# Patient Record
Sex: Male | Born: 1955 | Race: White | Hispanic: No | Marital: Married | Smoking: Never smoker
Health system: Southern US, Community
[De-identification: ages and names within clinical notes are randomized; demographics above are authoritative.]

## PROBLEM LIST (undated history)

## (undated) DIAGNOSIS — M1990 Unspecified osteoarthritis, unspecified site: Secondary | ICD-10-CM

## (undated) DIAGNOSIS — N400 Enlarged prostate without lower urinary tract symptoms: Secondary | ICD-10-CM

## (undated) HISTORY — PX: WISDOM TOOTH EXTRACTION: SHX21

## (undated) HISTORY — PX: HERNIA REPAIR: SHX51

## (undated) HISTORY — DX: Benign prostatic hyperplasia without lower urinary tract symptoms: N40.0

## (undated) HISTORY — PX: SHOULDER SURGERY: SHX246

## (undated) HISTORY — DX: Unspecified osteoarthritis, unspecified site: M19.90

## (undated) HISTORY — PX: OTHER SURGICAL HISTORY: SHX169

## (undated) HISTORY — PX: COLONOSCOPY: SHX174

---

## 2004-11-20 ENCOUNTER — Ambulatory Visit: Payer: Self-pay | Admitting: Internal Medicine

## 2004-12-04 ENCOUNTER — Ambulatory Visit: Payer: Self-pay | Admitting: Internal Medicine

## 2004-12-04 HISTORY — PX: POLYPECTOMY: SHX149

## 2006-11-15 ENCOUNTER — Ambulatory Visit: Payer: Self-pay | Admitting: Sports Medicine

## 2006-11-15 DIAGNOSIS — M7660 Achilles tendinitis, unspecified leg: Secondary | ICD-10-CM | POA: Insufficient documentation

## 2009-12-02 ENCOUNTER — Encounter (INDEPENDENT_AMBULATORY_CARE_PROVIDER_SITE_OTHER): Payer: Self-pay | Admitting: *Deleted

## 2010-07-23 NOTE — Letter (Signed)
Summary: Colonoscopy Letter  [facility]  [address] [place], [place] [address]   Phone: [phone]  Fax: [phone]      December 02, 2009 MRN: [id number]   Jonathan Wagner [address] [place], [place]  [address]   Dear Jonathan Wagner,   According to your medical record, it is time for you to schedule a Colonoscopy. The American Cancer Society recommends this procedure as a method to detect early colon cancer. Patients with a family history of colon cancer, or a personal history of colon polyps or inflammatory bowel disease are at increased risk.  This letter has been generated based on the recommendations made at the time of your procedure. If you feel that in your particular situation this may no longer apply, please contact our office.  Please call our office at [phone] to schedule this appointment or to update your records at your earliest convenience.  Thank you for cooperating with [place] to provide you with the very best care possible.   Sincerely,  Wilhemina Bonito. Marina Goodell, M.D.  [facility] [phone]

## 2011-01-05 ENCOUNTER — Encounter: Payer: Self-pay | Admitting: Sports Medicine

## 2011-01-05 ENCOUNTER — Ambulatory Visit (INDEPENDENT_AMBULATORY_CARE_PROVIDER_SITE_OTHER): Payer: BC Managed Care – PPO | Admitting: Sports Medicine

## 2011-01-05 VITALS — BP 122/79 | HR 67 | Ht 72.0 in | Wt 207.0 lb

## 2011-01-05 DIAGNOSIS — M79609 Pain in unspecified limb: Secondary | ICD-10-CM

## 2011-01-05 DIAGNOSIS — M25569 Pain in unspecified knee: Secondary | ICD-10-CM

## 2011-01-05 DIAGNOSIS — M79673 Pain in unspecified foot: Secondary | ICD-10-CM

## 2011-01-05 DIAGNOSIS — M222X9 Patellofemoral disorders, unspecified knee: Secondary | ICD-10-CM | POA: Insufficient documentation

## 2011-01-05 DIAGNOSIS — M7660 Achilles tendinitis, unspecified leg: Secondary | ICD-10-CM

## 2011-01-05 MED ORDER — KETOPROFEN POWD: Status: DC

## 2011-01-05 NOTE — Patient Instructions (Signed)
1. Do strengthening exercises from handout 1-2 times daily.  2. Wear knee sleeve with exercise.  3. Follow up in 4-6 weeks.  4. Use over the counter ibuprofen if needed for pain.  5. Use topical pain reliever as needed for heel pain.  6. Your diagnosis is patellofemoral syndrome.

## 2011-01-05 NOTE — Progress Notes (Signed)
  Subjective:    Patient ID: Jonathan Wagner, male    DOB: [birth year], 55 y.o.   MRN: [id number]  HPI  55 y/o male is complaining of "decreased power" in his right knee when trying to push off while playing tennis or running.  These symptoms started after he took a hard overhead swing while playing tennis.  He felt like the knee was "compressed" under his body weight.  He is also complaining of a dull nagging pain behind his patella which worsened around the time of this incident.  This pain is worse after sitting for long periods and improves with walking.  He continues to play tennis but doesn't feel as strong.  He denies locking, giving out, instability with pivoting, night pain or swelling of the knee.  He is also complaining of a return of his left heel pain.  No trauma.  The pain is stinging, occurs with eversion or plantar flexion and lasts for seconds.   Review of Systems     Objective:   Physical Exam  Right Knee: Pain and grinding of the lateral patella are noted with flexion. Normal to inspection with no erythema or effusion or obvious bony abnormalities. No joint line tenderness. ROM normal in flexion and extension and lower leg rotation. Ligaments with solid consistent endpoints including ACL, PCL, LCL, MCL. Negative Mcmurray's and provocative meniscal tests. Patellar and quadriceps tendons unremarkable. Hamstring and quadriceps strength is normal.  Left Heel: Mild tenderness to palpation of the lateral calcaneus No tenderness over the achilles tendon. Normal range of motion of the ankle and foot No tenderness to palpation of the foot other than the calcaneus          Assessment & Plan:

## 2011-01-06 NOTE — Assessment & Plan Note (Signed)
Given static exercises x 2 and then series of step up/ down exercises  We also encouraged some cross training on a bike or recumbent bike to help with quadriceps tone and strengthening  He is okay to continue playing or other sports as long as he keeps his pain level to the mild range  Use his compression sleeve when playing

## 2011-01-06 NOTE — Assessment & Plan Note (Signed)
Achilles tendons were unremarkable today. The left medial heel pain is probably from some alteration in gait. We will follow this and consider ultrasound scan if it does not resolve.

## 2011-06-25 ENCOUNTER — Ambulatory Visit (INDEPENDENT_AMBULATORY_CARE_PROVIDER_SITE_OTHER): Payer: BC Managed Care – PPO | Admitting: Sports Medicine

## 2011-06-25 VITALS — BP 110/70

## 2011-06-25 DIAGNOSIS — M6580 Other synovitis and tenosynovitis, unspecified site: Secondary | ICD-10-CM

## 2011-06-25 DIAGNOSIS — M25569 Pain in unspecified knee: Secondary | ICD-10-CM

## 2011-06-25 DIAGNOSIS — M76899 Other specified enthesopathies of unspecified lower limb, excluding foot: Secondary | ICD-10-CM | POA: Insufficient documentation

## 2011-06-25 DIAGNOSIS — M222X9 Patellofemoral disorders, unspecified knee: Secondary | ICD-10-CM

## 2011-06-25 NOTE — Progress Notes (Signed)
  Subjective:    Patient ID: Jonathan Wagner, male    DOB: Nov 08, 1955, 56 y.o.   MRN: [id number]  HPI  Pt presents to clinic for f/u of rt anterior knee pain which is improving. Able to play tennis more effectively, but still has some limitations. Has soreness after playing tennis, and aches after driving longer distances.  He continues to wear bodyhelix knee sleeve for activity which is helpful. No night time pain.    Review of Systems     Objective:   Physical Exam  Excellent quad strength bilat Full flexion of rt knee ACL stable on rt Rt hip abduction strong Neg thessaly on rt  Lt Knee: Normal to inspection with no erythema or effusion or obvious bony abnormalities. Palpation normal with no warmth or joint line tenderness or patellar tenderness or condyle tenderness. ROM normal in flexion and extension and lower leg rotation. Ligaments with solid consistent endpoints including ACL, PCL, LCL, MCL. Negative Mcmurray's and provocative meniscal tests. Non painful patellar compression. Patellar and quadriceps tendons unremarkable. Hamstring and quadriceps strength is normal.  MSK u/s: spurring along superior border of patella and calcific changes in quad tendon on rt  There is a small effusion in the superior patellar pouch on the right but not on the left Left superior patella does not show the same degree of spurring although there is one small spur The retropatellar groove and lateral and medial patellar borders look normal Patellar tendon is normal       Assessment & Plan:

## 2011-06-25 NOTE — Patient Instructions (Addendum)
Start doing exercises that are quad dominant such as- stationary bike, stair master, squats, and lunges   Do 3 sets of 15 reps - step up exercises holding about 50 lbs  Use bodyhelix knee sleeve for activity and driving  Ice after playing tennis  Please follow up in 3 months  Thank you for seeing [place] today!

## 2011-06-25 NOTE — Assessment & Plan Note (Signed)
see. Instructions  The calcific change along the superior patellar border and the spurring are likely the cause of his anterior knee pain  Approach this with rehabilitation and compression sleeve  If he did not respond I would consider adding nitroglycerin

## 2011-06-25 NOTE — Assessment & Plan Note (Signed)
He continues to show a pattern primarily of anterior knee pain Minimal if any signs of arthritis Exam is improved

## 2011-08-12 ENCOUNTER — Telehealth: Payer: Self-pay | Admitting: *Deleted

## 2011-08-12 NOTE — Telephone Encounter (Signed)
Called patient on home number, [phone].  I left a message for the patient to call back regarding scheduling his next colonoscopy with Dr. Yancey Flemings.  Advised that his last colonoscopy was 2006 and he is overdue.

## 2011-08-18 NOTE — Telephone Encounter (Signed)
Called home number and spoke to patients wife.  They did get my message I left on 08-12-2011 and she told me to call his cell number of [phone].  I called and left a message for him to call us and we will schedule him for his colonoscopy.

## 2011-08-20 ENCOUNTER — Other Ambulatory Visit: Payer: Self-pay | Admitting: *Deleted

## 2011-09-07 ENCOUNTER — Encounter: Payer: Self-pay | Admitting: Internal Medicine

## 2011-10-14 ENCOUNTER — Encounter: Payer: BC Managed Care – PPO | Admitting: Internal Medicine

## 2011-10-20 ENCOUNTER — Encounter: Payer: Self-pay | Admitting: Internal Medicine

## 2011-10-20 ENCOUNTER — Ambulatory Visit (AMBULATORY_SURGERY_CENTER): Payer: BC Managed Care – PPO | Admitting: *Deleted

## 2011-10-20 VITALS — Ht 72.0 in | Wt 214.0 lb

## 2011-10-20 DIAGNOSIS — Z1211 Encounter for screening for malignant neoplasm of colon: Secondary | ICD-10-CM

## 2011-10-20 MED ORDER — MOVIPREP 100 G PO SOLR: ORAL | Status: DC

## 2011-11-03 ENCOUNTER — Encounter: Payer: BC Managed Care – PPO | Admitting: Internal Medicine

## 2012-01-03 ENCOUNTER — Encounter: Payer: BC Managed Care – PPO | Admitting: Internal Medicine

## 2012-01-18 ENCOUNTER — Telehealth: Payer: Self-pay | Admitting: Internal Medicine

## 2012-01-18 DIAGNOSIS — Z1211 Encounter for screening for malignant neoplasm of colon: Secondary | ICD-10-CM

## 2012-01-19 MED ORDER — MOVIPREP 100 G PO SOLR: ORAL | Status: DC

## 2012-01-19 NOTE — Telephone Encounter (Signed)
Sent Moviprep to [facility] on [address]

## 2012-01-21 ENCOUNTER — Encounter: Payer: Self-pay | Admitting: Internal Medicine

## 2012-01-21 ENCOUNTER — Ambulatory Visit (AMBULATORY_SURGERY_CENTER): Payer: BC Managed Care – PPO | Admitting: Internal Medicine

## 2012-01-21 VITALS — BP 122/85 | HR 70 | Temp 97.3°F | Resp 18 | Ht 72.0 in | Wt 214.0 lb

## 2012-01-21 DIAGNOSIS — D126 Benign neoplasm of colon, unspecified: Secondary | ICD-10-CM

## 2012-01-21 DIAGNOSIS — Z86010 Personal history of colonic polyps: Secondary | ICD-10-CM

## 2012-01-21 DIAGNOSIS — K635 Polyp of colon: Secondary | ICD-10-CM

## 2012-01-21 DIAGNOSIS — Z1211 Encounter for screening for malignant neoplasm of colon: Secondary | ICD-10-CM

## 2012-01-21 MED ORDER — SODIUM CHLORIDE 0.9 % IV SOLN: 500.0000 mL | INTRAVENOUS | Status: DC

## 2012-01-21 NOTE — Progress Notes (Signed)
Patient did not experience any of the following events: a burn prior to discharge; a fall within the facility; wrong site/side/patient/procedure/implant event; or a hospital transfer or hospital admission upon discharge from the facility. (G8907) Patient did not have preoperative order for IV antibiotic SSI prophylaxis. (G8918)  

## 2012-01-21 NOTE — Patient Instructions (Addendum)

## 2012-01-21 NOTE — Op Note (Signed)
[facility] [address] [place], [place]  [address]  COLONOSCOPY PROCEDURE REPORT  PATIENT:  Jonathan, Wagner  MR#:  [id number] BIRTHDATE:  01-22-1956, 56 yrs. old  GENDER:  male ENDOSCOPIST:  Wilhemina Bonito. Eda Keys, MD REF. BY:  Surveillance Program Recall, PROCEDURE DATE:  01/21/2012 PROCEDURE:  Colonoscopy with snare polypectomy x 2 ASA CLASS:  Class I INDICATIONS:  history of pre-cancerous (adenomatous) colon polyps, surveillance and high-risk screening ; index exam 11-2004 w/ TAs MEDICATIONS:   MAC sedation, administered by CRNA, propofol (Diprivan) 250 mg IV  DESCRIPTION OF PROCEDURE:   After the risks benefits and alternatives of the procedure were thoroughly explained, informed consent was obtained.  Digital rectal exam was performed and revealed no abnormalities.   The LB CF-[id number] endoscope was introduced through the anus and advanced to the cecum, which was identified by both the appendix and ileocecal valve, without limitations.  The quality of the prep was good, using MoviPrep. The instrument was then slowly withdrawn as the colon was fully examined. <<PROCEDUREIMAGES>>  FINDINGS:  A 4mm polyp was found in the cecum and snared without cautery. Retrieval was successful.  A 9mm hyperplastic appearring sessile polyp was found in the mid transverse colon and snared without cautery. Retrieval was successful. Moderate diverticulosis was found in the sigmoid colon.   Retroflexed views in the rectum revealed no abnormalities and internal hemorrhoids.    The time to cecum = 1:50  minutes. The scope was then withdrawn in   21:25 minutes from the cecum and the procedure completed.  COMPLICATIONS:  None  ENDOSCOPIC IMPRESSION: 1) Diminutive polyp in the cecum - removed 2) Sessile polyp in the mid transverse colon - removed 3) Moderate diverticulosis in the sigmoid colon 4) Internal hemorrhoids  RECOMMENDATIONS: 1) Follow up colonoscopy in 5  years  ______________________________ Wilhemina Bonito. Eda Keys, MD  CC:  Geoffry Paradise, MD;  The Patient  n. eSIGNED:   Wilhemina Bonito. Eda Keys at 01/21/2012 12:11 PM  Junious Silk, [phone]

## 2012-01-21 NOTE — Progress Notes (Signed)
1240PATIENT STATING HE FEELS ONLY BLOATING NOW. STILL PASSING FLATUS. PATIENT COMPLAINED OF PAIN ON REMOVAL OF TAPE IN DISCONTINUING IV.,JERKING ARM AWAY FROM NURSE.

## 2012-01-24 ENCOUNTER — Telehealth: Payer: Self-pay

## 2012-01-24 NOTE — Telephone Encounter (Signed)
  Follow up Call-  Call back number 01/21/2012  Post procedure Call Back phone  # [phone]  Permission to leave phone message Yes     Patient questions:  Do you have a fever, pain , or abdominal swelling? no Pain Score  0 *  Have you tolerated food without any problems? yes  Have you been able to return to your normal activities? yes  Do you have any questions about your discharge instructions: Diet   no Medications  no Follow up visit  no  Do you have questions or concerns about your Care? no  Actions: * If pain score is 4 or above: No action needed, pain <4.

## 2012-02-01 ENCOUNTER — Encounter: Payer: Self-pay | Admitting: Internal Medicine

## 2012-11-30 ENCOUNTER — Ambulatory Visit (INDEPENDENT_AMBULATORY_CARE_PROVIDER_SITE_OTHER): Payer: BC Managed Care – PPO | Admitting: Sports Medicine

## 2012-11-30 VITALS — BP 118/81 | HR 78

## 2012-11-30 DIAGNOSIS — M7700 Medial epicondylitis, unspecified elbow: Secondary | ICD-10-CM

## 2012-11-30 DIAGNOSIS — M7712 Lateral epicondylitis, left elbow: Secondary | ICD-10-CM | POA: Insufficient documentation

## 2012-11-30 DIAGNOSIS — M7702 Medial epicondylitis, left elbow: Secondary | ICD-10-CM

## 2012-11-30 DIAGNOSIS — M76822 Posterior tibial tendinitis, left leg: Secondary | ICD-10-CM | POA: Insufficient documentation

## 2012-11-30 DIAGNOSIS — M76829 Posterior tibial tendinitis, unspecified leg: Secondary | ICD-10-CM

## 2012-11-30 MED ORDER — NITROGLYCERIN 0.2 MG/HR TD PT24: MEDICATED_PATCH | TRANSDERMAL | Status: DC

## 2012-11-30 NOTE — Assessment & Plan Note (Signed)
Ankle stability exercises Pigeon toe walking Followup in 6 weeks

## 2012-11-30 NOTE — Patient Instructions (Signed)
Thank you for coming in today. 1) Elbow: You have medial epicondylitis. This is also known as golfers elbow.  Please continue icing. Also do the eccentric wrist exercises. Remember to go down slowly.  Also use the nitroglycerin patch daily.  Return in 6 weeks.   2) Ankle: This is mild post tibial tendonitis.  Please use the compression sleeve.  Also do the heel raises.  2 sets of 30 with your toes forwards and 1 set with the toes in.   Return in 6 weeks.   Nitroglycerin Protocol   Apply 1/4 nitroglycerin patch to affected area daily.  Change position of patch within the affected area every 24 hours.  You may experience a headache during the first 1-2 weeks of using the patch, these should subside.  If you experience headaches after beginning nitroglycerin patch treatment, you may take your preferred over the counter pain reliever.  Another side effect of the nitroglycerin patch is skin irritation or rash related to patch adhesive.  Please notify our office if you develop more severe headaches or rash, and stop the patch.  Tendon healing with nitroglycerin patch may require 12 to 24 weeks depending on the extent of injury.  Men should not use if taking Viagra, Cialis, or Levitra.   Do not use if you have migraines or rosacea.

## 2012-11-30 NOTE — Assessment & Plan Note (Signed)
With tearing seen on ultrasound. Plan: Compression Eccentric exercises Nitroglycerin patch protocol Followup in 6 weeks

## 2012-11-30 NOTE — Progress Notes (Signed)
Jonathan Wagner is a 57 y.o. male who presents to [facility] today for  1) Left medial elbow pain present for 3-4 months. The pain is worse with tennis, especially forehand and overhand. He has tired icing and ibuprofen which help. He denies any radiating pain, weakness or numbness. No injury, locking or catching. No previous doctor visits for this issue.   2) Left medial ankle pain: Present for 1 month. He was playing tennis and reached for a ball. He felt a twinge in his medial ankle. He was able to continue playing but was less mobile.  He was seen on a Court by an orthopedic surgeon to diagnosed him tentatively with posterior tibialis tendinitis. He has used topical NSAIDs ice and range of motion exercises. He notes this is helping and he is slowly improving.    PMH reviewed.  History  Substance Use Topics  . Smoking status: Never Smoker   . Smokeless tobacco: Never Used  . Alcohol Use: 6.0 oz/week    12 drink(s) per week     Comment: DRINK A 12 PACK PER WEEK   ROS as above otherwise neg. No fevers or chills   Exam:  BP 118/81  Pulse 78 Gen: Well NAD MSK: Left elbow.  Normal-appearing no swelling Full range of motion Mildly tender over the medial upper condyle Nontender over lateral upper condyle anterior or posterior elbow Pain with resisted wrist flexion Strength is intact Pulses capillary refill and sensation are intact distally  Left foot: Broad forefoot with high arch Normal-appearing Achilles tendon. No ankle effusion Mildly tender over the posterior tibialis tendon course, especially in the tarsal tunnel. Normal motion sensation and capillary refill. Normal heel varus with toe standing  Gait is preserved  Limited musculoskeletal ultrasound of the left elbow: Medial upper condyle visualized.  Significant hypoechoic change with superficially and deep to the insertion. This is associated with increased Doppler activity.  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 No tears seen Normal flexor digitorum tendon and neurovascular structures.    No results found.

## 2012-12-25 ENCOUNTER — Telehealth: Payer: Self-pay | Admitting: *Deleted

## 2012-12-25 NOTE — Telephone Encounter (Signed)
Pt called wants to know if he should put NTG patch on left ankle for post tib tendinitis?  States he is getting swelling in the ankle despite cutting back on tennis, not too painful though.  Also wants to know if it is normal for him to get mild aching in elbow when wearing NTG patch?

## 2012-12-26 NOTE — Telephone Encounter (Signed)
Left VM for pt to return my call  

## 2012-12-27 NOTE — Telephone Encounter (Signed)
Spoke with pt - advised ok to try 1/4 NTG patch on ankle per Dr. Darrick Penna, and sensitivity may be coming from increased blood flow to elbow due to NTG.  Try to continue using if not causing pain.

## 2013-01-11 ENCOUNTER — Encounter: Payer: Self-pay | Admitting: Sports Medicine

## 2013-01-11 ENCOUNTER — Ambulatory Visit (INDEPENDENT_AMBULATORY_CARE_PROVIDER_SITE_OTHER): Payer: BC Managed Care – PPO | Admitting: Sports Medicine

## 2013-01-11 VITALS — BP 104/68 | HR 80 | Ht 72.0 in | Wt 214.0 lb

## 2013-01-11 DIAGNOSIS — M76822 Posterior tibial tendinitis, left leg: Secondary | ICD-10-CM

## 2013-01-11 DIAGNOSIS — M7702 Medial epicondylitis, left elbow: Secondary | ICD-10-CM

## 2013-01-11 DIAGNOSIS — M222X1 Patellofemoral disorders, right knee: Secondary | ICD-10-CM

## 2013-01-11 DIAGNOSIS — M76829 Posterior tibial tendinitis, unspecified leg: Secondary | ICD-10-CM

## 2013-01-11 DIAGNOSIS — M25569 Pain in unspecified knee: Secondary | ICD-10-CM

## 2013-01-11 DIAGNOSIS — M7700 Medial epicondylitis, unspecified elbow: Secondary | ICD-10-CM

## 2013-01-11 NOTE — Progress Notes (Signed)
  Subjective:    Patient ID: Jonathan Wagner, male    DOB: Nov 03, 1955, 57 y.o.   MRN: [id number]  HPI  Pt presents to clinic for f/u of Lt medial elbow, and left ankle which have improrved. Lt Ankle 75% better.  Body helix full ankle very helpful. Left med elbow 50% better. Unable to tolerate elbow sleeve during tennis due to tightness.  Icing after playing, has decreased string tension on racquet.  Reduced tennis from 4 to 2 times per week. Using NTG 1/4 patch daily, but feels more tender over elbow when he is wearing these.   Has developed rt anterior knee pain over the past 2 weeks.  No specific injury. Pain is a soreness that lasts for 2-3 days after playing tennis      Review of Systems     Objective:   Physical Exam  NAD  L ankle: No ttp or swelling of post tib tendon No swelling of dorsum No swelling of AT Ankle ligaments tight  L elbow: Mild pain with resisted elbow flexion No pain on finger flexion Full flexion, extension, IR and ER ttp over medial epicondyle   R Knee: Slight discomfort with Thessaly Knee: Normal to inspection with no erythema or effusion or obvious bony abnormalities. Palpation normal with no warmth or joint line tenderness or patellar tenderness or condyle tenderness. ROM normal in flexion and extension and lower leg rotation. Ligaments with solid consistent endpoints including ACL, PCL, LCL, MCL. Negative Mcmurray's and provocative meniscal tests. Non painful patellar compression. Patellar and quadriceps tendons unremarkable. Hamstring and quadriceps strength is normal.   MSK [place] Right knee shows a very small split in the medial meniscus The quadriceps tendon shows some spurring calcification No joint effusion There is some mild increased fluid around the lateral inferior patellar pole  Left medial elbow There is much less hypoechoic change in the flexor tendon No sign of a tear No significant calcification         Assessment &  Plan:

## 2013-01-11 NOTE — Assessment & Plan Note (Signed)
.   now affecting the right knee somewhat  He will go back to using a compression sleeve  Start some declined squats  Recheck if not resolving

## 2013-01-11 NOTE — Assessment & Plan Note (Signed)
This is much improved and he feels that most of the improvement is from full ankle compression sleeve  Keep up exercise and we will recheck if swelling or problems return  He was a full compression sleeve on his right ankle because it sometimes feels unstable

## 2013-01-11 NOTE — Patient Instructions (Addendum)
Continue wrist rolls, curls, and ball squeez for elbow  Do cone touches for ankle  Decline squats with heels elevated for knee   Try wearing elbow sleeve 30 min to 1 hour after playing tennis  Ok to try nitroglycerin 1/4 patch every other day, ok to discontinue if feels better without patch  Please follow up as needed  Thank you for seeing [place] today!

## 2013-01-11 NOTE — Assessment & Plan Note (Signed)
Clinically and by ultrasound this is significantly improved  I'm not sure that he needs to continue using the nitroglycerin although I suspect he has benefited  Now that his strength is returning and there is less ultrasound change in the tendon he can wean off nitroglycerin

## 2014-09-26 ENCOUNTER — Encounter: Payer: Self-pay | Admitting: Sports Medicine

## 2014-09-26 ENCOUNTER — Ambulatory Visit (INDEPENDENT_AMBULATORY_CARE_PROVIDER_SITE_OTHER): Payer: BLUE CROSS/BLUE SHIELD | Admitting: Sports Medicine

## 2014-09-26 VITALS — BP 106/88 | HR 82 | Ht 72.0 in | Wt 207.0 lb

## 2014-09-26 DIAGNOSIS — M7702 Medial epicondylitis, left elbow: Secondary | ICD-10-CM

## 2014-09-26 DIAGNOSIS — M25531 Pain in right wrist: Secondary | ICD-10-CM

## 2014-09-26 DIAGNOSIS — M75102 Unspecified rotator cuff tear or rupture of left shoulder, not specified as traumatic: Secondary | ICD-10-CM | POA: Insufficient documentation

## 2014-09-26 DIAGNOSIS — M7532 Calcific tendinitis of left shoulder: Secondary | ICD-10-CM

## 2014-09-26 DIAGNOSIS — M12812 Other specific arthropathies, not elsewhere classified, left shoulder: Secondary | ICD-10-CM | POA: Insufficient documentation

## 2014-09-26 DIAGNOSIS — M7712 Lateral epicondylitis, left elbow: Secondary | ICD-10-CM

## 2014-09-26 MED ORDER — NITROGLYCERIN 0.2 MG/HR TD PT24: MEDICATED_PATCH | TRANSDERMAL | Status: DC

## 2014-09-26 NOTE — Procedures (Signed)
Limited MSK Ultrasound of Left Shoulder: Findings: Biceps Tendon: Hypoechoic fluid with halo sign overall tendon appears to be intact with potential small midsubstance tear that would only involve tender 15% of tendon  Pec Major Insertion: Normal Subscapularis Tendon: Marked calcific changes with hypoechoic fluid in neovascularity Supraspinatus Tendon: Intact Infraspinatus/Teres Minor Tendon: Intact AC Joint: Marked arthropathy She will had is irregular on views obtained   Impression: The above findings are consistent with chronic calcific tendinosis of subscapularis, questionable partial biceps tendon tear, glenohumeral and AC Joint arthropathy     Limited MSK Ultrasound of Left Elbow: Findings:  chronic calcific changes with spurring at the insertion of the common extensor tendon with hypoechoic changes as well as neovascularity. Questionable small joint effusion Medial condyle normal-appearing no significant scarring or neovascularity   Impression:  chronic calcific tendinopathy of lateral epicondyles.

## 2014-09-26 NOTE — Assessment & Plan Note (Addendum)
Evidence of marked calcific tendinopathy at the insertion of the subscapularis with associated hypoechoic change and neovascularity Nitroglycerin protocol Therapeutic exercises including scapular stabilization, subscap strengthening including eccentric's Follow-up for repeat ultrasound in 8-12 weeks > Consider: Needle barbotage if not improving as expected

## 2014-09-26 NOTE — Assessment & Plan Note (Signed)
Chronic calcific changes at the insertion of the common extensor tendon with marked neovascularity Start nitroglycerin protocol here, quarter patch Eccentric exercises Patient are he has a body helix compression sleeve that reports feeling this affects his tennis game but he is able to wear it at other times. Follow-up for repeat ultrasound

## 2014-09-26 NOTE — Progress Notes (Signed)
Jonathan Wagner - 59 y.o. male MRN [id number]  Date of birth: February 08, 1956  SUBJECTIVE: CC: 1. Left shoulder pain, initial evaluation 2. Left elbow pain, follow-up 3. Right wrist pain, initial evaluation     HPI:   left-hand dominant  Left anterior shoulder pain that has been worsening over the past year.  Prior left partial rotator cuff tear 10 years ago, had it significantly improved. Worse with terminal internal rotation  & for flexion  Left elbow pain is significantly worse after playing tennis and is described as a deep ache over the lateral upper condyle but also deep within the elbow  Denies any numbness, tingling radiating into the left hand. No significant grip strength  Sharp shooting pain with wrist extension and elbow flexion when picking up briefcase  Using ice  Right wrist pain over the ulnar aspect of the wrist when applying direct pressure such as when getting out of a chair pushing down on a table  No known injury  No numbness tingling or weakness in the right upper extremity     ROS:  per HPI    HISTORY:  Past Medical, Surgical, Social, and Family History reviewed & updated per EMR.  Pertinent Historical Findings include: Social History   Occupational History  . Not on file.   Social History Main Topics  . Smoking status: Never Smoker   . Smokeless tobacco: Never Used  . Alcohol Use: 6.0 oz/week    12 drink(s) per week     Comment: DRINK A 12 PACK PER WEEK  . Drug Use: No  . Sexual Activity: Not on file    No specialty comments available. Problem  Calcific Tendinitis of Left Shoulder   Prior partial rotator cuff tear improved with physical therapy and conservative treatment. Approximately 10 years ago. No recurrent injury   Lateral Epicondylitis of Left Elbow   Chronic left elbow pain, Prior medial epicondylosis seen on ultrasound respond well to nitroglycerin Avid tennis player    OBJECTIVE:  VS:   HT:6' (182.9 cm)   WT:207 lb (93.895 kg)   BMI:28.1          BP:106/88 mmHg  HR:82bpm  TEMP: ( )  RESP:   PHYSICAL EXAM: GENERAL: Adult Caucasian male. No acute distress PSYCH: Alert and appropriately interactive. SKIN: No open skin lesions or abnormal skin markings on areas inspected as below VASCULAR: Bilateral radial pulses 2+/4. NEURO: Upper extremity strength is 5+/5 in all myotomes; sensation is intact to light touch in all dermatomes. Negative Spurling's compression test. Negative Lhermitte's compression test. LEFT SHOULDER: Overall well aligned. He does have a small amount of supraspinatus atrophy with potential small amount of weight and getting at rest. Strength is 5+/5 in internal rotation, external rotation, shoulder abduction and flexion. He does have smooth nonpainful range of motion with axial compression and circumduction. Full overhead range of motion LEFT ELBOW: Muscular, there is hypertrophic changes over the lateral condyle and no focal pain with wrist extension and grip strength supination pronation. Strength is 5+/5 in all the above. Elbow flexion and extension is full. Negative Tinel's. No TTP over medial epicondyles. RIGHT HAND: Overall well aligned normal-appearing. He does have small amount pain over the PC form with axial loading. Following this he is a small amount of tenderness to palpation. Wrist range of motion is full. No pain with ulnar deviation radial deviation. No significant tenderness palpation over the proximal ordered distal carpal row. No scapholunate instability. Only focal pain over the lateral aspect of the  pisiform. No TFCC tenderness.   DATA OBTAINED:   Limited MSK Ultrasound of Left Shoulder: Findings: Biceps Tendon: Hypoechoic fluid with halo sign overall tendon appears to be intact with potential small midsubstance tear that would only involve tender 15% of tendon  Pec Major Insertion: Normal Subscapularis Tendon: Marked calcific changes with hypoechoic fluid in neovascularity Supraspinatus  Tendon: Intact Infraspinatus/Teres Minor Tendon: Intact AC Joint: Marked arthropathy She will had is irregular on views obtained   Impression: The above findings are consistent with chronic calcific tendinosis of subscapularis, questionable partial biceps tendon tear, glenohumeral and AC Joint arthropathy     Limited MSK Ultrasound of Left Elbow: Findings:  chronic calcific changes with spurring at the insertion of the common extensor tendon with hypoechoic changes as well as neovascularity. Questionable small joint effusion Medial condyle normal-appearing no significant scarring or neovascularity   Impression:  chronic calcific tendinopathy of lateral epicondyles.       ASSESSMENT & PLAN: See problem based charting & AVS for additional documentation Problem List Items Addressed This Visit    Lateral epicondylitis of left elbow - Primary    Chronic calcific changes at the insertion of the common extensor tendon with marked neovascularity Start nitroglycerin protocol here, quarter patch Eccentric exercises Patient are he has a body helix compression sleeve that reports feeling this affects his tennis game but he is able to wear it at other times. Follow-up for repeat ultrasound      Relevant Medications   nitroGLYCERIN (NITRODUR) patch   Calcific tendinitis of left shoulder    Evidence of marked calcific tendinopathy at the insertion of the subscapularis with associated hypoechoic change and neovascularity Nitroglycerin protocol Therapeutic exercises including scapular stabilization, subscap strengthening including eccentric's Follow-up for repeat ultrasound in 8-12 weeks > Consider: Needle barbotage if not improving as expected      Relevant Medications   nitroGLYCERIN (NITRODUR) patch    Other Visit Diagnoses    Right wrist pain          Conservative care for Right Wrist. If not improving consider further imaging   FOLLOW UP:  Return in about 2 months (around 11/26/2014).

## 2014-11-12 ENCOUNTER — Ambulatory Visit: Payer: BLUE CROSS/BLUE SHIELD | Admitting: Sports Medicine

## 2015-01-06 ENCOUNTER — Ambulatory Visit (INDEPENDENT_AMBULATORY_CARE_PROVIDER_SITE_OTHER): Payer: BLUE CROSS/BLUE SHIELD | Admitting: Family Medicine

## 2015-01-06 ENCOUNTER — Encounter: Payer: Self-pay | Admitting: Family Medicine

## 2015-01-06 VITALS — BP 123/83 | HR 72 | Ht 72.0 in | Wt 205.0 lb

## 2015-01-06 DIAGNOSIS — M7532 Calcific tendinitis of left shoulder: Secondary | ICD-10-CM | POA: Diagnosis not present

## 2015-01-06 DIAGNOSIS — M25512 Pain in left shoulder: Secondary | ICD-10-CM

## 2015-01-06 MED ORDER — METHYLPREDNISOLONE ACETATE 40 MG/ML IJ SUSP
40.0000 mg | Freq: Once | INTRAMUSCULAR | Status: AC
  Administered 2015-01-06: 40 mg via INTRA_ARTICULAR

## 2015-01-06 NOTE — Patient Instructions (Signed)
Your exam indicates tendinopathy of your supraspinatus, subscapularis, and developing frozen shoulder. You were given a combination subacromial and intraarticular injection today. Call me in 1-2 weeks to let me know how you're doing. No tennis in the meantime. Do the arm circles, pendulums, table slide exercises I showed you. Ice or heat (whichever feels better) 15 minutes at a time 3-4 times a day. Aleve 2 tabs twice a day with food for pain and inflammation. If still not improving at that point I would consider nitro patches and/or injection directly into the calcific tendinopathy with several needle passes.

## 2015-01-08 NOTE — Assessment & Plan Note (Signed)
consistent with a combination of supraspinatus, calcific subscapularis tendinopathy and developing adhesive capsulitis.  We discussed his options (physical therapy, regular nitro patch usage, injections (locally to subscapularis, subacromial, and/or intraarticular), barbotage).  I recommended we go ahead with combination subacromial, intraarticular injections.  Shown codman exercises to do daily.  Ice/heat, nsaids.  If he would like to do the 2 syringe method of barbotage to calcific tendinopathy recommend follow-up with Dr. Oneida Alar - we could also just give a cortisone injection with multiple fenestrations into calcific areas of subscapularis as well if not improving over the next 1-2 weeks.  After informed written consent, patient was seated on exam table. Left shoulder was prepped with alcohol swab and utilizing posterior approach, patient's left shoulder was injected with 6:2 marcaine:depomedrol with half in the subacromial space and half in glenohumeral space.  Patient tolerated the procedure well without immediate complications.

## 2015-01-08 NOTE — Progress Notes (Signed)
PCP: Geoffery Lyons, MD  Subjective:   HPI: Patient is a 59 y.o. male here for left shoulder pain.  Patient was seen in  office 3 months ago for his left shoulder. Diagnosed with calcific subscapularis tendinopathy. Has done home exercises since then. Tried nitro but didn't help much so stopped this. Pain currently 3/10, up to 8/10 with movements. Has had remote history of rotator cuff tear - healed without surgery. Tried changing strings in his racket. Decreased mobility due to pain. Taking advil. Did fine playing on Thursday (3 doubles matches) but current soreness really worsened Friday. Pain anterior, posterior, and superior shoulder.  No past medical history on file.  Current Outpatient Prescriptions on File Prior to Visit  Medication Sig Dispense Refill  . Multiple Vitamins-Minerals (MULTIVITAMIN WITH MINERALS) tablet Take 1 tablet by mouth daily.    . nitroGLYCERIN (NITRODUR - DOSED IN MG/24 HR) 0.2 mg/hr patch 1/4 patch to elbow & 1/4 patch to left anterior shoulder daily for pain. Change patch daily. 30 patch 1  . Red Yeast Rice Extract 600 MG TABS Take 2 tablets by mouth daily.    . saw palmetto 80 MG capsule Take 80 mg by mouth 2 (two) times daily.    . vitamin C (ASCORBIC ACID) 250 MG tablet Take 250 mg by mouth daily.     No current facility-administered medications on file prior to visit.    Past Surgical History  Procedure Laterality Date  . Colonoscopy  2006  . Polypectomy  12/04/2004  . Fatty tumor      REMOVED AS CHILD  . Wisdom tooth extraction      No Known Allergies  History   Social History  . Marital Status: Married    Spouse Name: N/A  . Number of Children: N/A  . Years of Education: N/A   Occupational History  . Not on file.   Social History Main Topics  . Smoking status: Never Smoker   . Smokeless tobacco: Never Used  . Alcohol Use: 7.2 oz/week    12 Standard drinks or equivalent per week     Comment: DRINK A 12 PACK PER  WEEK  . Drug Use: No  . Sexual Activity: Not on file   Other Topics Concern  . Not on file   Social History Narrative    Family History  Problem Relation Age of Onset  . Breast cancer Mother   . Prostate cancer Father     BP 123/83 mmHg  Pulse 72  Ht 6' (1.829 m)  Wt 205 lb (92.987 kg)  BMI 27.80 kg/m2  Review of Systems: See HPI above.    Objective:  Physical Exam:  Gen: NAD  Left shoulder: No swelling, ecchymoses.  No gross deformity. TTP anterior shoulder and over lateral aspect of supraspinatus, upper arm.  No other tenderness. ER 60 degrees (80 on right); abduction 90 degrees, flexion 100 degrees.  Painful arc. Positive Hawkins, Neers. Negative Yergasons. Strength 4/5 with empty can and resisted internal rotation - both painful.  No pain resisted external rotation. Negative apprehension. NV intact distally.    Assessment & Plan:  1. Left shoulder pain - consistent with a combination of supraspinatus, calcific subscapularis tendinopathy and developing adhesive capsulitis.  We discussed his options (physical therapy, regular nitro patch usage, injections (locally to subscapularis, subacromial, and/or intraarticular), barbotage).  I recommended we go ahead with combination subacromial, intraarticular injections.  Shown codman exercises to do daily.  Ice/heat, nsaids.  If he would like to do  the 2 syringe method of barbotage to calcific tendinopathy recommend follow-up with Dr. Oneida Alar - we could also just give a cortisone injection with multiple fenestrations into calcific areas of subscapularis as well if not improving over the next 1-2 weeks.  After informed written consent, patient was seated on exam table. Left shoulder was prepped with alcohol swab and utilizing posterior approach, patient's left shoulder was injected with 6:2 marcaine:depomedrol with half in the subacromial space and half in glenohumeral space.  Patient tolerated the procedure well without immediate  complications.

## 2015-01-14 ENCOUNTER — Ambulatory Visit: Payer: BLUE CROSS/BLUE SHIELD | Admitting: Sports Medicine

## 2015-01-23 ENCOUNTER — Telehealth: Payer: Self-pay | Admitting: Family Medicine

## 2015-01-23 NOTE — Telephone Encounter (Signed)
Spoke to patient and gave him information provided by physician. Patient agreed.

## 2015-01-23 NOTE — Telephone Encounter (Signed)
Yes I think so.  But advise him to start with hitting lightly with a friend or against a wall for a couple days before he advances to practicing and serving then finally back to match play.  This way he will ensure he's feeling well enough to compete without overdoing it.  Thanks!

## 2015-08-05 ENCOUNTER — Ambulatory Visit (INDEPENDENT_AMBULATORY_CARE_PROVIDER_SITE_OTHER): Payer: BLUE CROSS/BLUE SHIELD | Admitting: Sports Medicine

## 2015-08-05 ENCOUNTER — Encounter: Payer: Self-pay | Admitting: Sports Medicine

## 2015-08-05 VITALS — BP 110/60 | Ht 72.0 in | Wt 206.0 lb

## 2015-08-05 DIAGNOSIS — M7532 Calcific tendinitis of left shoulder: Secondary | ICD-10-CM

## 2015-08-05 DIAGNOSIS — M76822 Posterior tibial tendinitis, left leg: Secondary | ICD-10-CM

## 2015-08-05 NOTE — Assessment & Plan Note (Signed)
I gave him a series of exercises to do to protect his rotator cuff Today he is strong and doesn't have any obvious symptoms of the tendon is bothering him Encouraged to do some specific exercises with cord

## 2015-08-05 NOTE — Assessment & Plan Note (Signed)
He has recurrence of this left posterior tibial tendinitis This may be related to his high arches and excess strain while playing tennis Consider custom orthotics Exercises Arnica gel Compression Heel lift Recheck if not resolving in 6 weeks

## 2015-08-05 NOTE — Progress Notes (Signed)
Patient ID: Jonathan Wagner, male   DOB: 1956-01-23, 60 y.o.   MRN: [id number]  Patient has calcific tendinopathy of his left rotator cuff This is chronic He plays tennis 3-4 times a week 6 months ago he had a severe flare Corticosteroid injection by Dr. Barbaraann Barthel help relieve this  Now the left shoulder tends to pop more No pain with tennis or activity at this time  Left ankle pain He does not recall a specific injury but for the last week his left ankle has been swelling along the inside This hurts up to the medial shin He noticed it after playing tennis Anti-inflammatory medicines have not helped Ice does seem to help He has an ankle compression sleeve and thinks that this helps somewhat  P/s/f HX Married  Works in Science [employer] Non smoker  ROS No Generalized joint pain No numbness or tingling in his hands or feet  Pexam No acute distress Alert and oriented x3 BP 110/60 mmHg  Ht 6' (1.829 m)  Wt 206 lb (93.441 kg)  BMI 27.93 kg/m2  Left Shoulder: Inspection reveals no abnormalities, atrophy or asymmetry. Palpation is normal with no tenderness over AC joint or bicipital groove. ROM is full in all planes. Rotator cuff strength normal throughout. No signs of impingement with negative Neer and Hawkin's tests, empty can. Speeds and Yergason's tests normal. No labral pathology noted with negative Obrien's, negative clunk and good stability. Normal scapular function observed. No painful arc and no drop arm sign. No apprehension sign  Lt Ankle Ankle:  swelling just behind medial malleolus Range of motion is full in all directions. Strength is 5/5 in all directions. Stable lateral and medial ligaments; squeeze test and kleiger test unremarkable; Talar dome nontender; No pain at base of 5th MT; No tenderness over cuboid; No tenderness over N spot or navicular prominence No tenderness on posterior aspects of lateral and medial malleolus No sign of peroneal tendon  subluxations; Testing peroneal strength causes slt pain but strong Negative tarsal tunnel tinel's Able to walk 4 steps.

## 2015-08-05 NOTE — Patient Instructions (Signed)
Rotator cuff exercise regimen  Buy a stretch cord Do all tennis strokes with chord Do standing position with elevation of arm  For ankle - do toe walks up step 5 to 10 times  Try some arnica gel and rub into sore area 3 to 4 times a day  Consider custom orthotics  Check with me in about 3 months

## 2015-08-14 ENCOUNTER — Ambulatory Visit: Payer: BLUE CROSS/BLUE SHIELD | Admitting: Sports Medicine

## 2015-12-18 DIAGNOSIS — Z125 Encounter for screening for malignant neoplasm of prostate: Secondary | ICD-10-CM | POA: Diagnosis not present

## 2015-12-18 DIAGNOSIS — E784 Other hyperlipidemia: Secondary | ICD-10-CM | POA: Diagnosis not present

## 2015-12-18 DIAGNOSIS — Z0000 Encounter for general adult medical examination without abnormal findings: Secondary | ICD-10-CM | POA: Diagnosis not present

## 2015-12-26 DIAGNOSIS — Z1212 Encounter for screening for malignant neoplasm of rectum: Secondary | ICD-10-CM | POA: Diagnosis not present

## 2015-12-29 DIAGNOSIS — Z0000 Encounter for general adult medical examination without abnormal findings: Secondary | ICD-10-CM | POA: Diagnosis not present

## 2015-12-29 DIAGNOSIS — Z1389 Encounter for screening for other disorder: Secondary | ICD-10-CM | POA: Diagnosis not present

## 2015-12-29 DIAGNOSIS — J301 Allergic rhinitis due to pollen: Secondary | ICD-10-CM | POA: Diagnosis not present

## 2015-12-29 DIAGNOSIS — Z125 Encounter for screening for malignant neoplasm of prostate: Secondary | ICD-10-CM | POA: Diagnosis not present

## 2015-12-29 DIAGNOSIS — E784 Other hyperlipidemia: Secondary | ICD-10-CM | POA: Diagnosis not present

## 2015-12-29 DIAGNOSIS — N401 Enlarged prostate with lower urinary tract symptoms: Secondary | ICD-10-CM | POA: Diagnosis not present

## 2016-03-26 DIAGNOSIS — L814 Other melanin hyperpigmentation: Secondary | ICD-10-CM | POA: Diagnosis not present

## 2016-03-26 DIAGNOSIS — D1801 Hemangioma of skin and subcutaneous tissue: Secondary | ICD-10-CM | POA: Diagnosis not present

## 2016-03-26 DIAGNOSIS — L821 Other seborrheic keratosis: Secondary | ICD-10-CM | POA: Diagnosis not present

## 2016-03-26 DIAGNOSIS — D485 Neoplasm of uncertain behavior of skin: Secondary | ICD-10-CM | POA: Diagnosis not present

## 2016-03-26 DIAGNOSIS — L570 Actinic keratosis: Secondary | ICD-10-CM | POA: Diagnosis not present

## 2016-03-26 DIAGNOSIS — L218 Other seborrheic dermatitis: Secondary | ICD-10-CM | POA: Diagnosis not present

## 2016-03-26 DIAGNOSIS — D225 Melanocytic nevi of trunk: Secondary | ICD-10-CM | POA: Diagnosis not present

## 2016-04-07 DIAGNOSIS — R3912 Poor urinary stream: Secondary | ICD-10-CM | POA: Diagnosis not present

## 2016-04-07 DIAGNOSIS — N401 Enlarged prostate with lower urinary tract symptoms: Secondary | ICD-10-CM | POA: Diagnosis not present

## 2016-04-07 DIAGNOSIS — R972 Elevated prostate specific antigen [PSA]: Secondary | ICD-10-CM | POA: Diagnosis not present

## 2016-04-07 DIAGNOSIS — R351 Nocturia: Secondary | ICD-10-CM | POA: Diagnosis not present

## 2016-04-08 DIAGNOSIS — H1851 Endothelial corneal dystrophy: Secondary | ICD-10-CM | POA: Diagnosis not present

## 2016-04-08 DIAGNOSIS — H5213 Myopia, bilateral: Secondary | ICD-10-CM | POA: Diagnosis not present

## 2016-04-27 ENCOUNTER — Ambulatory Visit: Payer: BLUE CROSS/BLUE SHIELD | Admitting: Sports Medicine

## 2016-05-21 DIAGNOSIS — R972 Elevated prostate specific antigen [PSA]: Secondary | ICD-10-CM | POA: Diagnosis not present

## 2016-05-26 DIAGNOSIS — R351 Nocturia: Secondary | ICD-10-CM | POA: Diagnosis not present

## 2016-05-26 DIAGNOSIS — N411 Chronic prostatitis: Secondary | ICD-10-CM | POA: Diagnosis not present

## 2016-05-26 DIAGNOSIS — N401 Enlarged prostate with lower urinary tract symptoms: Secondary | ICD-10-CM | POA: Diagnosis not present

## 2016-05-26 DIAGNOSIS — N5201 Erectile dysfunction due to arterial insufficiency: Secondary | ICD-10-CM | POA: Diagnosis not present

## 2016-07-01 DIAGNOSIS — H02054 Trichiasis without entropian left upper eyelid: Secondary | ICD-10-CM | POA: Diagnosis not present

## 2016-07-29 ENCOUNTER — Encounter: Payer: Self-pay | Admitting: Sports Medicine

## 2016-07-29 ENCOUNTER — Ambulatory Visit: Payer: Self-pay

## 2016-07-29 ENCOUNTER — Ambulatory Visit (INDEPENDENT_AMBULATORY_CARE_PROVIDER_SITE_OTHER): Payer: BLUE CROSS/BLUE SHIELD | Admitting: Sports Medicine

## 2016-07-29 VITALS — BP 116/92 | HR 67 | Ht 72.0 in | Wt 204.0 lb

## 2016-07-29 DIAGNOSIS — M25512 Pain in left shoulder: Secondary | ICD-10-CM

## 2016-07-29 DIAGNOSIS — M7532 Calcific tendinitis of left shoulder: Secondary | ICD-10-CM | POA: Diagnosis not present

## 2016-07-29 MED ORDER — NITROGLYCERIN 0.2 MG/HR TD PT24: MEDICATED_PATCH | TRANSDERMAL | 1 refills | Status: DC

## 2016-07-29 NOTE — Patient Instructions (Addendum)
1. Micro-tear of biceps 2. Arthritic joints with bone spur  Exercises to do daily (multiple): Biceps curls - 5lb weights Forehand strokes with weights   Rotator cuff exercises Spokes of a wheel (3 ways) 3 sets of 15 with weight 3-5lbs If can go to 90 degrees then can advance - take it all the way up Do serving motion with dumbbell after this  Avoid overhead presses, bench press, and flies across chest. Avoid kettle weight or exercises with weights in the middle. Can do dumbbells cause weights stay wide.   When can play tennis again: Lower string tension (try 48) and over grip on racket    Nitroglycerin Protocol   Apply 1/4 nitroglycerin patch to affected area daily.  Change position of patch within the affected area every 24 hours.  You may experience a headache during the first 1-2 weeks of using the patch, these should subside.  If you experience headaches after beginning nitroglycerin patch treatment, you may take your preferred over the counter pain reliever.  Another side effect of the nitroglycerin patch is skin irritation or rash related to patch adhesive.  Please notify our office if you develop more severe headaches or rash, and stop the patch.  Tendon healing with nitroglycerin patch may require 12 to 24 weeks depending on the extent of injury.  Men should not use if taking Viagra, Cialis, or Levitra.   Do not use if you have migraines or rosacea.

## 2016-07-29 NOTE — Assessment & Plan Note (Signed)
Calcific changes have partially resolved New rotator cuff injury with edema in suprapinatus 10% fiber tear of biceps  Given HEP NTG protocol Modify weight exercises to protect AC joint and spur at upper humeral head  Reck 6 to 8 wks

## 2016-07-29 NOTE — Progress Notes (Signed)
  Jonathan Wagner - 61 y.o. male MRN [id number]  Date of birth: 1955-09-12  SUBJECTIVE:   CC: Left Shoulder Pain  HPI: Patient states that he was playing tennis last week and thinks he might have injured his shoulder. He was returning a shot and felt a sudden onset of discomfort over his biceps. He believes it is more MSK pain then actual shoulder pain. He was able to finish playing that day without discomfort but later that night describes soreness and stiffness. He then again went to play on Sunday but still felt the discomfort. Has not taken any medications. Left-hand-dominant. Pain is worse with internal rotation of arm.   ROS  No neck pain No radicular sxs into arm   HISTORY: Past Medical, Surgical, Social, and Family History Reviewed & Updated per EMR.   Pertinent Historical Findings include: History of patellofemoral syndrome, lateral epicondylitis, calcific tendinitis of left shoulder   PHYSICAL EXAM:  BP (!) 116/92   Pulse 67   Ht 6' (1.829 m)   Wt 204 lb (92.5 kg)   BMI 27.67 kg/m   General: alert, well-developed, NAD Neurologic: No focal deficits, A&Ox3.   Left Shoulder: Inspection reveals no abnormalities, atrophy or asymmetry. Palpation is normal with no tenderness over AC joint or bicipital groove. ROM is full in all planes. Rotator cuff strength normal throughout. + signs of impingement with  Neer and Hawkin's tests, empty can. Speeds and Yergason's tests normal.  Slight pain with resistance in upper biceps. No labral pathology noted with negative Obrien's, negative clunk and good stability. Normal scapular function observed. No painful arc and no drop arm sign. No apprehension sign  Limited MSK ultrasound of left shoulder:   Edema noted with hypoechoic change in supraspinatus No tear in supraspinatus but changes are where he had tear in 2016 Biceps tendon shows small fiber disruption (10%) with hypoechoic change over tendon Infraspinatus, TM are  normal Subscapularis is normal with less calcification than in past AC joint shows DJD changes with narrowing and calcification  Impression: Findings suggestive of rotator cuff tendinopathy involving supraspinatus and a grade 2 injury to biceps tendon  Ultrasound and interpretation by Wolfgang Phoenix. Fields, MD     ASSESSMENT & PLAN: See problem based charting & AVS for pt instructions.  A:  Rotator Cuff tindinopathy   P: see prob. list  Luiz Blare, DO 07/29/2016, 10:38 AM PGY-3, [facility]  I observed and examined the patient with the resident and agree with assessment and plan.  Note reviewed and modified by me. Stefanie Libel, MD

## 2016-08-05 ENCOUNTER — Ambulatory Visit: Payer: BLUE CROSS/BLUE SHIELD | Admitting: Sports Medicine

## 2016-08-19 DIAGNOSIS — R972 Elevated prostate specific antigen [PSA]: Secondary | ICD-10-CM | POA: Diagnosis not present

## 2016-08-19 DIAGNOSIS — D171 Benign lipomatous neoplasm of skin and subcutaneous tissue of trunk: Secondary | ICD-10-CM | POA: Diagnosis not present

## 2016-08-25 ENCOUNTER — Other Ambulatory Visit: Payer: Self-pay | Admitting: *Deleted

## 2016-08-25 MED ORDER — KETOPROFEN POWD: 2 refills | Status: DC

## 2016-09-01 DIAGNOSIS — N5201 Erectile dysfunction due to arterial insufficiency: Secondary | ICD-10-CM | POA: Diagnosis not present

## 2016-09-01 DIAGNOSIS — N4341 Spermatocele of epididymis, single: Secondary | ICD-10-CM | POA: Diagnosis not present

## 2016-09-01 DIAGNOSIS — R3912 Poor urinary stream: Secondary | ICD-10-CM | POA: Diagnosis not present

## 2016-09-01 DIAGNOSIS — R972 Elevated prostate specific antigen [PSA]: Secondary | ICD-10-CM | POA: Diagnosis not present

## 2016-09-01 DIAGNOSIS — N401 Enlarged prostate with lower urinary tract symptoms: Secondary | ICD-10-CM | POA: Diagnosis not present

## 2016-09-09 ENCOUNTER — Encounter: Payer: Self-pay | Admitting: Sports Medicine

## 2016-09-09 ENCOUNTER — Encounter (INDEPENDENT_AMBULATORY_CARE_PROVIDER_SITE_OTHER): Payer: Self-pay

## 2016-09-09 ENCOUNTER — Ambulatory Visit: Payer: Self-pay

## 2016-09-09 ENCOUNTER — Ambulatory Visit (INDEPENDENT_AMBULATORY_CARE_PROVIDER_SITE_OTHER): Payer: BLUE CROSS/BLUE SHIELD | Admitting: Sports Medicine

## 2016-09-09 VITALS — BP 113/69 | Ht 72.0 in | Wt 202.0 lb

## 2016-09-09 DIAGNOSIS — M25512 Pain in left shoulder: Secondary | ICD-10-CM

## 2016-09-09 DIAGNOSIS — M12812 Other specific arthropathies, not elsewhere classified, left shoulder: Secondary | ICD-10-CM | POA: Diagnosis not present

## 2016-09-09 DIAGNOSIS — M75102 Unspecified rotator cuff tear or rupture of left shoulder, not specified as traumatic: Secondary | ICD-10-CM

## 2016-09-09 NOTE — Assessment & Plan Note (Signed)
Significant RC tear  Confirm w MRI and refer to DR Tamera Punt for evaluation and to see if surgery indicated

## 2016-09-09 NOTE — Patient Instructions (Addendum)
Your ultrasound today is concerning for a rotator cuff tear  1) We will set you up to get an MRI 2) We will set you up to see Dr. Tamera Punt, Monday, 09/27/16 at [facility], [address], [place], [place]. [phone]. Arrival time is 830a

## 2016-09-09 NOTE — Progress Notes (Signed)
SUBJECTIVE:   CC: Left Shoulder Pain  HPI:  Jonathan Wagner is a 61 yo male here for continued evaluation of shoulder pain.  The patient was last in clinic 2/8, which was approximately a week p injuring his left shoulder playing tennis. At that time, he was feeling soreness and stiffness that were limiting his activity and were worse with internal rotation of the arm. At that visit, [place] was performed and was  suggestive of rotator cuff tendinopathy involving supraspinatus and a grade 2 injury to biceps tendon. At that visit, he was given HEP and placed on NTG protocol. AC joint exercises were recommended.  Since that visit, the patient tried nitroglycerin patches and rest from activity, with near resolution of all the pain. He returned to full tennis activity.  However, Monday night, patient went to hit a ball and had sharp pain in the anterior and posterior AC that radiated all throughout the shoulder and into the left upper arm. Pain continued all evening. He continues to have mild pain but significant pain with straight arm raise   ROS  No neck pain No radicular sxs into arm   History:  patellofemoral syndrome lateral epicondylitis calcific tendinitis of left shoulder  PHYSICAL EXAM:  Blood pressure 113/69, height 6' (1.829 m), weight 202 lb (91.6 kg).  General: alert, well-developed, NAD Neurologic: No focal deficits, A&Ox3.  Left Shoulder: Palpation is normal with mild tenderness over [facility] joint  Inspection reveals no abnormalities, atrophy or asymmetry. +drop arm sign, apprehension sign new from prior exam Weakness on external rotation Weakness on elevation and in empty can position Intact on internal rotation  + signs of impingement with Neer and Hawkin's tests new from prior exam  MSK ultrasound of left shoulder:   Biceps tendon shows mild increase in hypoechoic change proximally but tendon fibers intact Lipoma in upper humerus but not a biceps retraction Supraspinatus  tendon - with post fibers intact but with hypoechoic change; middle portion seems retracted and tendon not visualized.  infraspinatus with tendons retracted and cannot visualize fibers; hypoechoic change in posterior capsule.  Teres minor not visualized. Intact subscapularis  Impression:; retracted full thickness rotator cuff tear involving supraspinatus and infraspinatus tendons  Ultrasound and interpretation by Wolfgang Phoenix. Fields, MD   ASSESSMENT & PLAN: See problem based charting & AVS for pt instructions.  A:  Jonathan Wagner is a 61 yo male with a history of rotator cuff tendinopathy, calcific tendonitis of the shoulder and lateral epicondylitis and an avid tennis player who presented for continued shoulder pain, now with significantly worsened physical exam and [place] concerning for a frank rotator cuff tear  P: - Will obtain MRI shoulder - Will refer patient to Dr. Tamera Punt of orthopedics  Stefanie Libel, MD

## 2016-09-15 DIAGNOSIS — M75122 Complete rotator cuff tear or rupture of left shoulder, not specified as traumatic: Secondary | ICD-10-CM | POA: Diagnosis not present

## 2016-09-19 ENCOUNTER — Ambulatory Visit
Admission: RE | Admit: 2016-09-19 | Discharge: 2016-09-19 | Disposition: A | Discharge status: Home / Self Care | Payer: BLUE CROSS/BLUE SHIELD | Source: Ambulatory Visit | Attending: Sports Medicine | Admitting: Sports Medicine

## 2016-09-19 DIAGNOSIS — S46012A Strain of muscle(s) and tendon(s) of the rotator cuff of left shoulder, initial encounter: Secondary | ICD-10-CM | POA: Diagnosis not present

## 2016-09-19 DIAGNOSIS — M25512 Pain in left shoulder: Secondary | ICD-10-CM

## 2016-09-27 DIAGNOSIS — M67912 Unspecified disorder of synovium and tendon, left shoulder: Secondary | ICD-10-CM | POA: Diagnosis not present

## 2016-09-28 DIAGNOSIS — Y999 Unspecified external cause status: Secondary | ICD-10-CM | POA: Diagnosis not present

## 2016-09-28 DIAGNOSIS — M25512 Pain in left shoulder: Secondary | ICD-10-CM | POA: Diagnosis not present

## 2016-09-28 DIAGNOSIS — R600 Localized edema: Secondary | ICD-10-CM | POA: Diagnosis not present

## 2016-09-28 DIAGNOSIS — S46012A Strain of muscle(s) and tendon(s) of the rotator cuff of left shoulder, initial encounter: Secondary | ICD-10-CM | POA: Diagnosis not present

## 2016-09-28 DIAGNOSIS — G8918 Other acute postprocedural pain: Secondary | ICD-10-CM | POA: Diagnosis not present

## 2016-09-28 DIAGNOSIS — S46012D Strain of muscle(s) and tendon(s) of the rotator cuff of left shoulder, subsequent encounter: Secondary | ICD-10-CM | POA: Diagnosis not present

## 2016-09-28 DIAGNOSIS — M7542 Impingement syndrome of left shoulder: Secondary | ICD-10-CM | POA: Diagnosis not present

## 2016-09-28 DIAGNOSIS — M75122 Complete rotator cuff tear or rupture of left shoulder, not specified as traumatic: Secondary | ICD-10-CM | POA: Diagnosis not present

## 2016-10-06 DIAGNOSIS — Z9889 Other specified postprocedural states: Secondary | ICD-10-CM | POA: Diagnosis not present

## 2016-10-27 DIAGNOSIS — R972 Elevated prostate specific antigen [PSA]: Secondary | ICD-10-CM | POA: Diagnosis not present

## 2016-10-27 DIAGNOSIS — N5201 Erectile dysfunction due to arterial insufficiency: Secondary | ICD-10-CM | POA: Diagnosis not present

## 2016-10-27 DIAGNOSIS — R351 Nocturia: Secondary | ICD-10-CM | POA: Diagnosis not present

## 2016-10-27 DIAGNOSIS — N401 Enlarged prostate with lower urinary tract symptoms: Secondary | ICD-10-CM | POA: Diagnosis not present

## 2016-11-10 DIAGNOSIS — Z9889 Other specified postprocedural states: Secondary | ICD-10-CM | POA: Diagnosis not present

## 2016-11-12 DIAGNOSIS — M25512 Pain in left shoulder: Secondary | ICD-10-CM | POA: Diagnosis not present

## 2016-11-12 DIAGNOSIS — M75112 Incomplete rotator cuff tear or rupture of left shoulder, not specified as traumatic: Secondary | ICD-10-CM | POA: Diagnosis not present

## 2016-11-24 DIAGNOSIS — M75112 Incomplete rotator cuff tear or rupture of left shoulder, not specified as traumatic: Secondary | ICD-10-CM | POA: Diagnosis not present

## 2016-11-24 DIAGNOSIS — M25512 Pain in left shoulder: Secondary | ICD-10-CM | POA: Diagnosis not present

## 2016-11-29 DIAGNOSIS — M75112 Incomplete rotator cuff tear or rupture of left shoulder, not specified as traumatic: Secondary | ICD-10-CM | POA: Diagnosis not present

## 2016-11-29 DIAGNOSIS — M25512 Pain in left shoulder: Secondary | ICD-10-CM | POA: Diagnosis not present

## 2016-11-30 ENCOUNTER — Encounter: Payer: Self-pay | Admitting: Internal Medicine

## 2016-12-02 DIAGNOSIS — M75112 Incomplete rotator cuff tear or rupture of left shoulder, not specified as traumatic: Secondary | ICD-10-CM | POA: Diagnosis not present

## 2016-12-02 DIAGNOSIS — M25512 Pain in left shoulder: Secondary | ICD-10-CM | POA: Diagnosis not present

## 2016-12-09 DIAGNOSIS — M25512 Pain in left shoulder: Secondary | ICD-10-CM | POA: Diagnosis not present

## 2016-12-09 DIAGNOSIS — M75112 Incomplete rotator cuff tear or rupture of left shoulder, not specified as traumatic: Secondary | ICD-10-CM | POA: Diagnosis not present

## 2016-12-16 DIAGNOSIS — M75112 Incomplete rotator cuff tear or rupture of left shoulder, not specified as traumatic: Secondary | ICD-10-CM | POA: Diagnosis not present

## 2016-12-16 DIAGNOSIS — M25512 Pain in left shoulder: Secondary | ICD-10-CM | POA: Diagnosis not present

## 2016-12-17 DIAGNOSIS — Z9889 Other specified postprocedural states: Secondary | ICD-10-CM | POA: Diagnosis not present

## 2016-12-27 DIAGNOSIS — Z0000 Encounter for general adult medical examination without abnormal findings: Secondary | ICD-10-CM | POA: Diagnosis not present

## 2016-12-27 DIAGNOSIS — E784 Other hyperlipidemia: Secondary | ICD-10-CM | POA: Diagnosis not present

## 2016-12-27 DIAGNOSIS — Z125 Encounter for screening for malignant neoplasm of prostate: Secondary | ICD-10-CM | POA: Diagnosis not present

## 2016-12-29 DIAGNOSIS — Z1212 Encounter for screening for malignant neoplasm of rectum: Secondary | ICD-10-CM | POA: Diagnosis not present

## 2016-12-31 DIAGNOSIS — M25512 Pain in left shoulder: Secondary | ICD-10-CM | POA: Diagnosis not present

## 2016-12-31 DIAGNOSIS — M75112 Incomplete rotator cuff tear or rupture of left shoulder, not specified as traumatic: Secondary | ICD-10-CM | POA: Diagnosis not present

## 2017-01-03 DIAGNOSIS — Z23 Encounter for immunization: Secondary | ICD-10-CM | POA: Diagnosis not present

## 2017-01-03 DIAGNOSIS — E784 Other hyperlipidemia: Secondary | ICD-10-CM | POA: Diagnosis not present

## 2017-01-03 DIAGNOSIS — E663 Overweight: Secondary | ICD-10-CM | POA: Diagnosis not present

## 2017-01-03 DIAGNOSIS — N401 Enlarged prostate with lower urinary tract symptoms: Secondary | ICD-10-CM | POA: Diagnosis not present

## 2017-01-03 DIAGNOSIS — Z0000 Encounter for general adult medical examination without abnormal findings: Secondary | ICD-10-CM | POA: Diagnosis not present

## 2017-01-03 DIAGNOSIS — Z1389 Encounter for screening for other disorder: Secondary | ICD-10-CM | POA: Diagnosis not present

## 2017-01-03 DIAGNOSIS — J3089 Other allergic rhinitis: Secondary | ICD-10-CM | POA: Diagnosis not present

## 2017-01-04 DIAGNOSIS — M25512 Pain in left shoulder: Secondary | ICD-10-CM | POA: Diagnosis not present

## 2017-01-04 DIAGNOSIS — M75112 Incomplete rotator cuff tear or rupture of left shoulder, not specified as traumatic: Secondary | ICD-10-CM | POA: Diagnosis not present

## 2017-01-07 DIAGNOSIS — M75112 Incomplete rotator cuff tear or rupture of left shoulder, not specified as traumatic: Secondary | ICD-10-CM | POA: Diagnosis not present

## 2017-01-07 DIAGNOSIS — M25512 Pain in left shoulder: Secondary | ICD-10-CM | POA: Diagnosis not present

## 2017-01-21 DIAGNOSIS — M75112 Incomplete rotator cuff tear or rupture of left shoulder, not specified as traumatic: Secondary | ICD-10-CM | POA: Diagnosis not present

## 2017-01-21 DIAGNOSIS — M25512 Pain in left shoulder: Secondary | ICD-10-CM | POA: Diagnosis not present

## 2017-01-24 DIAGNOSIS — M75112 Incomplete rotator cuff tear or rupture of left shoulder, not specified as traumatic: Secondary | ICD-10-CM | POA: Diagnosis not present

## 2017-01-24 DIAGNOSIS — M25512 Pain in left shoulder: Secondary | ICD-10-CM | POA: Diagnosis not present

## 2017-01-28 DIAGNOSIS — Z09 Encounter for follow-up examination after completed treatment for conditions other than malignant neoplasm: Secondary | ICD-10-CM | POA: Diagnosis not present

## 2017-01-28 DIAGNOSIS — M75112 Incomplete rotator cuff tear or rupture of left shoulder, not specified as traumatic: Secondary | ICD-10-CM | POA: Diagnosis not present

## 2017-01-28 DIAGNOSIS — M25512 Pain in left shoulder: Secondary | ICD-10-CM | POA: Diagnosis not present

## 2017-02-04 DIAGNOSIS — M75112 Incomplete rotator cuff tear or rupture of left shoulder, not specified as traumatic: Secondary | ICD-10-CM | POA: Diagnosis not present

## 2017-02-04 DIAGNOSIS — M25512 Pain in left shoulder: Secondary | ICD-10-CM | POA: Diagnosis not present

## 2017-02-07 DIAGNOSIS — M25512 Pain in left shoulder: Secondary | ICD-10-CM | POA: Diagnosis not present

## 2017-02-07 DIAGNOSIS — M75112 Incomplete rotator cuff tear or rupture of left shoulder, not specified as traumatic: Secondary | ICD-10-CM | POA: Diagnosis not present

## 2017-02-14 DIAGNOSIS — R351 Nocturia: Secondary | ICD-10-CM | POA: Diagnosis not present

## 2017-02-14 DIAGNOSIS — N401 Enlarged prostate with lower urinary tract symptoms: Secondary | ICD-10-CM | POA: Diagnosis not present

## 2017-02-14 DIAGNOSIS — N4341 Spermatocele of epididymis, single: Secondary | ICD-10-CM | POA: Diagnosis not present

## 2017-02-14 DIAGNOSIS — R972 Elevated prostate specific antigen [PSA]: Secondary | ICD-10-CM | POA: Diagnosis not present

## 2017-02-28 DIAGNOSIS — M25512 Pain in left shoulder: Secondary | ICD-10-CM | POA: Diagnosis not present

## 2017-02-28 DIAGNOSIS — M75112 Incomplete rotator cuff tear or rupture of left shoulder, not specified as traumatic: Secondary | ICD-10-CM | POA: Diagnosis not present

## 2017-03-03 ENCOUNTER — Ambulatory Visit (INDEPENDENT_AMBULATORY_CARE_PROVIDER_SITE_OTHER): Payer: BLUE CROSS/BLUE SHIELD | Admitting: Sports Medicine

## 2017-03-03 DIAGNOSIS — M75102 Unspecified rotator cuff tear or rupture of left shoulder, not specified as traumatic: Secondary | ICD-10-CM

## 2017-03-03 DIAGNOSIS — M9262 Juvenile osteochondrosis of tarsus, left ankle: Secondary | ICD-10-CM

## 2017-03-03 DIAGNOSIS — M12812 Other specific arthropathies, not elsewhere classified, left shoulder: Secondary | ICD-10-CM | POA: Diagnosis not present

## 2017-03-03 DIAGNOSIS — M25572 Pain in left ankle and joints of left foot: Secondary | ICD-10-CM

## 2017-03-03 DIAGNOSIS — M2170 Unequal limb length (acquired), unspecified site: Secondary | ICD-10-CM

## 2017-03-03 DIAGNOSIS — M25579 Pain in unspecified ankle and joints of unspecified foot: Secondary | ICD-10-CM | POA: Insufficient documentation

## 2017-03-03 DIAGNOSIS — M9261 Juvenile osteochondrosis of tarsus, right ankle: Secondary | ICD-10-CM | POA: Insufficient documentation

## 2017-03-03 NOTE — Assessment & Plan Note (Signed)
Physical exam noted patient had a non-equal leg length when in the seated position. This corrected when patient was lying flat. Etiology deemed most likely secondary to pelvic tilt. - Hip and core strengthening exercises provided today.  - Hip rotation, and belly despite an exercises. - Will follow

## 2017-03-03 NOTE — Patient Instructions (Signed)
It was a pleasure seeing you today in our clinic. Today we discussed your shoulder and ankle issues. Here is the treatment plan we have discussed and agreed upon together:   - Continue following up with Dr. Tamera Punt. Once he has released you plan to follow-up with [place] for further work regarding your rehabilitation towards returning to tennis. - Regarding your ankle pain, we have developed these exercises for you to perform daily:  - Heel raises (toe-in) 3x10 >> with 1 second hold at peak  - Heel raises (neutral) 3x10 >> with 1 second hold at peak  - "Pigeon toed" walking 3x30 seconds >> progress toward inclusion of stairs  - Hip leg raises ("walking over the fence") 3x10 >> with 1 second hold  - Pelvic tilt ("belly to spine") 3x10 >> with 1 second hold

## 2017-03-03 NOTE — Assessment & Plan Note (Signed)
Still being followed by orthopedics for standard postsurgical management. - Patient to follow-up once he has been released by ortho, and at that time we can work on additional rehabilitation to have patient ready for returning to tennis.

## 2017-03-03 NOTE — Assessment & Plan Note (Signed)
Patient is here with complaints of left-sided heel and ankle pain consistent with retrocalcaneal bursitis vs posterior tibialis tendinitis. - RICE therapy as needed. - Home exercises provided today: Posterior tib and Achilles tendon exercises.

## 2017-03-03 NOTE — Progress Notes (Signed)
HPI  CC: Left shoulder pain and left ankle pain. Patient is here with complaints of left-sided shoulder pain. He states that he underwent a rotator cuff reconstructive surgery on 09/28/16. He states that since that time he seems to be progressing well through physical therapy but is worried that he will not be able to return to his regular tennis activity level that he desires. Patient is mostly interested in additional expertise on specific strengthening exercises he can do to get back into tennis. He denies any set backs since his surgery. He endorses relatively good strength and range of motion. No numbness, weakness, or paresthesias. Patient is very pleased with his current surgical care.  Regarding patient's left ankle. He states that he has been dealing with this off and on for a long time. He recently had an exacerbation of the pain with normal activities of daily living. He denies any injury, trauma, or fall. He states that the pain is located along the medial aspect of the ankle just distal to the medial malleolus. Pain does not radiate. He endorses swelling in the area of pain which seems to wax and wane. He denies any feelings of instability. No weakness, numbness, or paresthesias.  Medications/Interventions Tried: rest, PT (shoulder)  See HPI and/or previous note for associated ROS.  Objective: BP 126/90   Ht 6' (1.829 m)   Wt 208 lb (94.3 kg)   BMI 28.21 kg/m  Gen: NAD, well groomed, a/o x3, normal affect.  CV: Well-perfused. Warm.  Resp: Non-labored.  Neuro: Sensation intact throughout. No gross coordination deficits.  Gait: Nonpathologic posture, unremarkable stride without signs of limp or balance issues. Shoulder, Left: No specific area of focal tenderness. Well-healing surgical scarring noted. No evidence of bony deformity, asymmetry, or muscle atrophy; No tenderness over long head of biceps (bicipital groove). No TTP at [facility] joint. Active ROM mostly full in forward flexion  and abduction. ER reduced significantly. IR reduced w/ thumb only to Sacrum. Strength 5/5 throughout. No abnormal scapular function observed. Sensation intact. Peripheral pulses intact. Ankle/Foot, Left: TTP noted at the posterior medial aspect of the ankle just anterior and superior to the insertion of the Achilles tendon radiating distally. No visible erythema, swelling, ecchymosis, or bony deformity. Transverse arch grossly intact; No evidence of tibiotalar deviation; Range of motion is full in all directions. Strength is 5/5 in all directions. No tenderness at the insertion/body/myotendinous junction of the Achilles tendon; Mild Haglund's deformity noted. No peroneal tendon tenderness or subluxation; No tenderness on posterior aspects of lateral and medial malleolus; Stable lateral and medial ligaments; Unremarkable squeeze and kleiger tests; Talar dome nontender; Unremarkable calcaneal squeeze; No plantar calcaneal tenderness; No tenderness over the navicular prominence; No tenderness over cuboid; No pain at base of 5th MT; No tenderness at the distal metatarsals; Able to walk 4 steps.  Leg Length: Equal while laying flat. 1 inch difference (R>L) while in the seated position.   Assessment and plan:  Pain in joint, ankle and foot Patient is here with complaints of left-sided heel and ankle pain consistent with retrocalcaneal bursitis vs posterior tibialis tendinitis. - RICE therapy as needed. - Home exercises provided today: Posterior tib and Achilles tendon exercises.  Acquired unequal leg length Physical exam noted patient had a non-equal leg length when in the seated position. This corrected when patient was lying flat. Etiology deemed most likely secondary to pelvic tilt. - Hip and core strengthening exercises provided today.  - Hip rotation, and belly despite an exercises. -  Will follow  Rotator cuff tear arthropathy, left Still being followed by orthopedics for standard postsurgical  management. - Patient to follow-up once he has been released by ortho, and at that time we can work on additional rehabilitation to have patient ready for returning to tennis.   Meds ordered this encounter  Medications  . finasteride (PROSCAR) 5 MG tablet    Sig: Take 5 mg by mouth daily.     Elberta Leatherwood, MD,MS [facility] 03/03/2017 12:53 PM

## 2017-03-04 DIAGNOSIS — M75112 Incomplete rotator cuff tear or rupture of left shoulder, not specified as traumatic: Secondary | ICD-10-CM | POA: Diagnosis not present

## 2017-03-04 DIAGNOSIS — M25512 Pain in left shoulder: Secondary | ICD-10-CM | POA: Diagnosis not present

## 2017-03-11 DIAGNOSIS — M75112 Incomplete rotator cuff tear or rupture of left shoulder, not specified as traumatic: Secondary | ICD-10-CM | POA: Diagnosis not present

## 2017-04-01 DIAGNOSIS — Z09 Encounter for follow-up examination after completed treatment for conditions other than malignant neoplasm: Secondary | ICD-10-CM | POA: Diagnosis not present

## 2017-04-08 DIAGNOSIS — H5213 Myopia, bilateral: Secondary | ICD-10-CM | POA: Diagnosis not present

## 2017-04-08 DIAGNOSIS — H1851 Endothelial corneal dystrophy: Secondary | ICD-10-CM | POA: Diagnosis not present

## 2017-04-08 DIAGNOSIS — H2513 Age-related nuclear cataract, bilateral: Secondary | ICD-10-CM | POA: Diagnosis not present

## 2017-04-11 DIAGNOSIS — M25512 Pain in left shoulder: Secondary | ICD-10-CM | POA: Diagnosis not present

## 2017-04-11 DIAGNOSIS — M75112 Incomplete rotator cuff tear or rupture of left shoulder, not specified as traumatic: Secondary | ICD-10-CM | POA: Diagnosis not present

## 2017-04-15 DIAGNOSIS — M25512 Pain in left shoulder: Secondary | ICD-10-CM | POA: Diagnosis not present

## 2017-04-15 DIAGNOSIS — M75112 Incomplete rotator cuff tear or rupture of left shoulder, not specified as traumatic: Secondary | ICD-10-CM | POA: Diagnosis not present

## 2017-05-18 DIAGNOSIS — L821 Other seborrheic keratosis: Secondary | ICD-10-CM | POA: Diagnosis not present

## 2017-05-18 DIAGNOSIS — L650 Telogen effluvium: Secondary | ICD-10-CM | POA: Diagnosis not present

## 2017-05-18 DIAGNOSIS — L814 Other melanin hyperpigmentation: Secondary | ICD-10-CM | POA: Diagnosis not present

## 2017-05-18 DIAGNOSIS — L570 Actinic keratosis: Secondary | ICD-10-CM | POA: Diagnosis not present

## 2017-05-18 DIAGNOSIS — L218 Other seborrheic dermatitis: Secondary | ICD-10-CM | POA: Diagnosis not present

## 2017-05-20 DIAGNOSIS — M25512 Pain in left shoulder: Secondary | ICD-10-CM | POA: Diagnosis not present

## 2017-05-20 DIAGNOSIS — M75112 Incomplete rotator cuff tear or rupture of left shoulder, not specified as traumatic: Secondary | ICD-10-CM | POA: Diagnosis not present

## 2017-06-02 DIAGNOSIS — L650 Telogen effluvium: Secondary | ICD-10-CM | POA: Diagnosis not present

## 2017-06-02 DIAGNOSIS — L570 Actinic keratosis: Secondary | ICD-10-CM | POA: Diagnosis not present

## 2017-06-03 DIAGNOSIS — Z09 Encounter for follow-up examination after completed treatment for conditions other than malignant neoplasm: Secondary | ICD-10-CM | POA: Diagnosis not present

## 2017-06-17 DIAGNOSIS — N401 Enlarged prostate with lower urinary tract symptoms: Secondary | ICD-10-CM | POA: Diagnosis not present

## 2017-06-27 DIAGNOSIS — N401 Enlarged prostate with lower urinary tract symptoms: Secondary | ICD-10-CM | POA: Diagnosis not present

## 2017-06-27 DIAGNOSIS — R972 Elevated prostate specific antigen [PSA]: Secondary | ICD-10-CM | POA: Diagnosis not present

## 2017-06-27 DIAGNOSIS — N5201 Erectile dysfunction due to arterial insufficiency: Secondary | ICD-10-CM | POA: Diagnosis not present

## 2017-06-27 DIAGNOSIS — R351 Nocturia: Secondary | ICD-10-CM | POA: Diagnosis not present

## 2017-07-27 ENCOUNTER — Ambulatory Visit: Payer: BLUE CROSS/BLUE SHIELD | Admitting: Family Medicine

## 2017-07-27 ENCOUNTER — Encounter: Payer: Self-pay | Admitting: Family Medicine

## 2017-07-27 VITALS — BP 110/70 | Ht 72.0 in | Wt 206.0 lb

## 2017-07-27 DIAGNOSIS — M79662 Pain in left lower leg: Secondary | ICD-10-CM

## 2017-07-27 NOTE — Progress Notes (Signed)
Chief complaint: Left calf tightness x 1 week  History of present illness: Jonathan Wagner is a 62 year old male who presents to sports medicine office today with chief complaint of left calf tightness.  He reports that symptoms have been present for approximately 1 week.  He reports that he was doing some rowing and leg extension exercises with his wife at the gym last week, also reports playing tennis 2 nights ago. After the tennis match, he started feeling tightness along the posterior aspect of his left leg.  He reports specifically the distal hamstrings and proximal gastrocnemius as to where he felt tight. He particularly notices this with leg extension.  He does not report of any pain.  He does not report of any knee pain.  He does have known history of left knee moderate osteoarthritis, does wear body helix compressive sleeve over the left knee.  He reports that he does have tennis match tomorrow night, wanted to come in today to make sure that he was safe to play in his tennis match tomorrow.  He reports that he does have office appointment here next week to see Dr. Oneida Alar in regards to his left shoulder, as well as for follow-up of this issue.  He does report approximately 40 years ago he did tear the medial head of his left gastrocnemius. He reports that treatment at that time consisted of charcoal compressive therapy.  He reports that he had to be hospitalized for a few days.  He reports after that time he noticed a slight protuberance along the medial head of the gastroc.  Fortunately, he reports of having no complications from this and had no symptoms up until 1 week ago.  He does not report of any numbness, tingling, or burning paresthesias.  He reports symptoms have been the same over the last few days.  He has not tried any type of medicines, stretching, compression, or any other sort of therapy.  No symptoms waking him up at nighttime.  He does not report of any low back or left hip pain.  Review of  systems:  As stated above  Interval past medical history, surgical history, family history, and social history obtained and reviewed.  Medical history is otherwise unremarkable, did have left rotator cuff repair back in April 2018 by Dr. Tamera Punt, surgical history as noted above, also had wisdom teeth extraction, does not report of any current tobacco use, family history notable for prostate cancer and breast cancer  Physical exam: Vital signs are reviewed and are documented in the chart Gen.: Alert, oriented, appears stated age, in no apparent distress HEENT: Moist oral mucosa Respiratory: Normal respirations, able to speak in full sentences Cardiac: Regular rate, distal pulses 2+ Integumentary: No rashes on visible skin:  Neurologic: Strength is intact with glutes, quadriceps, hamstring, and gastrocnemius/soleus complex in both legs, sensation 2+ bilateral lower extremities Psych: Normal affect, mood is described as good Musculoskeletal: Inspection of left leg reveals no obvious deformity or muscle atrophy, no warmth, erythema, ecchymosis, or effusion, he does have a slight protuberance of the medial head of the gastroc, it is non-mobile, nonfluctuant, no tenderness to palpation, measures approximately 2 x 2 cm with no surrounding warmth, erythema, ecchymosis, or effusion, no tenderness to palpation along the medial lateral joint lines of the left knee but no tenderness to palpation over the distal hamstrings as well as the entirety of the gastrocnemius soleus complex no antalgic gait, full knee range of motion, going from 0 degrees to 135 degrees, [employer]  test negative   Limited musculoskeletal ultrasound was performed in the office today of his left gastrocnemius-soleus complex: -He does have slight hypoechoic changes of the medial head of the gastroc where the protuberance is felt -He does have a few areas in the medial head of the gastroc where there are a few slight areas of  hypoechogenicity consistent microtears -Unremarkable appearing distal hamstring -Unremarkable appearing distal gastrocnemius/proximal soleus complex  Impression: Mild left medial gastrocnemius tendinopathy with no frank tearing  Ultrasound performed and interpreted by Mort Sawyers, MD  Assessment and plan: 1. Left calf tightness, with ultrasound evidence of mild left medial gastrocnemius tendinopathy with no frank tearing 2.  History of left rotator cuff tear involving the supraspinatus and infraspinatus, surgically repaired by Dr. Tamera Punt with [place] back in April 2018 3. History of mild left knee DJD  Plan: Reassured Dwain today that he has no frank tearing of his left distal hamstrings or proximal gastrocnemius.  I do feel that it is safe for him to play in tennis match tomorrow evening.  Discussed calf compressive sleeve, discussing importance of keeping the gastrocnemius-soleus complex warm, gentle massaging, and heat. Did provide home exercise program involving hamstring and calf strengthening exercises, including Askling exercises.  He will return next week to see Dr. Oneida Alar or sooner as needed.  Mort Sawyers, M.D. [facility]

## 2017-08-02 ENCOUNTER — Encounter: Payer: Self-pay | Admitting: Sports Medicine

## 2017-08-02 ENCOUNTER — Ambulatory Visit
Admission: RE | Admit: 2017-08-02 | Discharge: 2017-08-02 | Disposition: A | Discharge status: Home / Self Care | Payer: BLUE CROSS/BLUE SHIELD | Source: Ambulatory Visit | Attending: Sports Medicine | Admitting: Sports Medicine

## 2017-08-02 ENCOUNTER — Ambulatory Visit: Payer: BLUE CROSS/BLUE SHIELD | Admitting: Sports Medicine

## 2017-08-02 VITALS — BP 110/80 | Ht 72.0 in | Wt 204.0 lb

## 2017-08-02 DIAGNOSIS — M75102 Unspecified rotator cuff tear or rupture of left shoulder, not specified as traumatic: Secondary | ICD-10-CM

## 2017-08-02 DIAGNOSIS — M12812 Other specific arthropathies, not elsewhere classified, left shoulder: Secondary | ICD-10-CM | POA: Diagnosis not present

## 2017-08-02 DIAGNOSIS — M25561 Pain in right knee: Secondary | ICD-10-CM

## 2017-08-02 DIAGNOSIS — M25562 Pain in left knee: Secondary | ICD-10-CM | POA: Diagnosis not present

## 2017-08-02 DIAGNOSIS — M1712 Unilateral primary osteoarthritis, left knee: Secondary | ICD-10-CM

## 2017-08-02 DIAGNOSIS — M179 Osteoarthritis of knee, unspecified: Secondary | ICD-10-CM | POA: Diagnosis not present

## 2017-08-02 NOTE — Assessment & Plan Note (Signed)
This is very good post repair  Work on [employer]  Doing well!

## 2017-08-02 NOTE — Assessment & Plan Note (Signed)
XR confirms mild medial cmpt OA Noted on RT as well but that is not symptomatic  Compression Quad and hip exercises  Avoid deep knee bend

## 2017-08-02 NOTE — Progress Notes (Signed)
Left rotator cuff repair and stiffness Left knee and calf tightness  Patient had a rotator cuff reapair last spring by Dr Tamera Punt This has steadily improved with PT  Now restarting tennis and getting some tightness Wants advice on tennis specific exercises and prevention  Saw Dr Raynelle Bring last week for calf tightness Worse after tennis Some mild gastroc swelling but no other findings Now notes that calf is less tight but left knee remains tight Unable to bend as well as Right Now mild knee pain after playing  ROS No weakness now in left shoulder No locking of left knee No giving way of left knee Left knee feels better with compression  PE Muscular M in NAD BP 110/80   Ht 6' (1.829 m)   Wt 204 lb (92.5 kg)   BMI 27.67 kg/m   Shoulder: left Inspection reveals no abnormalities, atrophy or asymmetry. Palpation is normal with no tenderness over AC joint or bicipital groove. ROM is full in all planes.except IR on back scratch Rotator cuff strength normal throughout. No signs of impingement with negative Neer and Hawkin's tests, empty can sign. Speeds and Yergason's tests normal. No labral pathology noted with negative Obrien's, negative clunk and good stability. Normal scapular function observed. No painful arc and no drop arm sign. No apprehension sign  Left knee Knee: Normal to inspection but probably mild effusion on palpation Palpation shows no warmth or joint line tenderness or patellar tenderness or condyle tenderness. ROM normal  extension and lower leg rotation. However, flexion is limited at 125 vs 135 on RT Ligaments with solid consistent endpoints including ACL, PCL, LCL, MCL. Negative Mcmurray's and provocative meniscal tests. Non painful patellar compression. Patellar and quadriceps tendons unremarkable. Hamstring and quadriceps strength is normal.  Calf not swollen today

## 2017-08-02 NOTE — Patient Instructions (Signed)
Cross chest stretch daily - 3 x 20 secs  Behind back stretch  Tennis drill exercises only 5 lbs Forehand  Backhand Serve - 2.5 lbs  Build to 3 sets of 15  High row and low row 3 sets of 15 with 5 lbs  We need xrays of knees

## 2017-08-21 DIAGNOSIS — R05 Cough: Secondary | ICD-10-CM | POA: Diagnosis not present

## 2017-08-21 DIAGNOSIS — J101 Influenza due to other identified influenza virus with other respiratory manifestations: Secondary | ICD-10-CM | POA: Diagnosis not present

## 2017-08-21 DIAGNOSIS — R0981 Nasal congestion: Secondary | ICD-10-CM | POA: Diagnosis not present

## 2017-11-01 ENCOUNTER — Encounter: Payer: Self-pay | Admitting: Internal Medicine

## 2017-11-24 ENCOUNTER — Encounter

## 2017-11-24 ENCOUNTER — Encounter: Payer: Self-pay | Admitting: Sports Medicine

## 2017-11-24 ENCOUNTER — Ambulatory Visit: Payer: BLUE CROSS/BLUE SHIELD | Admitting: Sports Medicine

## 2017-11-24 VITALS — BP 124/68 | Ht 72.0 in | Wt 205.0 lb

## 2017-11-24 DIAGNOSIS — M1712 Unilateral primary osteoarthritis, left knee: Secondary | ICD-10-CM | POA: Diagnosis not present

## 2017-11-24 MED ORDER — DICLOFENAC SODIUM 1 % TD GEL: 2.0000 g | Freq: Four times a day (QID) | TRANSDERMAL | 1 refills | Status: DC

## 2017-11-25 ENCOUNTER — Encounter: Payer: Self-pay | Admitting: Sports Medicine

## 2017-11-25 NOTE — Progress Notes (Signed)
   Subjective:    Patient ID: Jonathan Wagner, male    DOB: 1955-07-13, 62 y.o.   MRN: [id number]  HPI chief complaint: Left knee pain  62 year old male comes in today complaining of medial sided left knee pain for the past week. He's had problems with the same knee in the past. He saw Dr. Oneida Alar back in February and was diagnosed with some mild arthritis in his knee. He was given a home exercise program and given a compression sleeve. He is an avid Firefighter. While playing tennis last week he felt a "twinge" when lunging for a ball. He had no immediate pain or swelling but the following day had significant pain and some mild swelling. He has not played tennis since. His pain and swelling have improved.He denies locking or catching.He has not been taking any pain medication but he has used Voltaren gel in the past with good results.  Interim medical history reviewed Medications reviewed Allergies reviewed    Review of Systems    as above Objective:   Physical Exam  Well-developed, well-nourished. No acute distress. Awake alert and oriented 3. Vital signs reviewed  Left knee: Full range of motion. No effusion. He is tender to palpation along the medial joint line but has a negative McMurray's and a negative Thessaly's.No tenderness along the lateral joint line. Knee is stable to ligamentous exam. No significant patellofemoral crepitus. Neurovascularly intact distally. Good strength. Walking without a limp.  MSK ultrasound of the left knee was performed. Limited images were obtained. There is no obvious joint effusion. There is some bulging of the anterior horn of the medial meniscus from the medial joint line but no obvious tear. Posterior horn appears unremarkable. Lateral meniscus appears unremarkable.  X-rays of the left knee from earlier this year shows mild degenerative changes and nothing acute.      Assessment & Plan:   Left knee pain secondary to medial compartmental DJD  versus occult meniscal tear  Patient is reassured that I see no obvious meniscal tear on ultrasound. His lack of joint effusion is also reassuring. I think he should refrain from tennis for another week or so and concentrate on his home exercises. He has a good understanding of those exercises which were given to him by Dr. Oneida Alar. Also recommended that he wear his compression sleeve for the next week or so before weaning to wearing it only while playing tennis. Prescription for Voltaren gel to use twice daily as needed. I discussed the merits of further diagnostic imaging or a cortisone injection if his symptoms return. He will follow-up for ongoing or calcium issues.

## 2017-12-15 ENCOUNTER — Ambulatory Visit: Payer: BLUE CROSS/BLUE SHIELD | Admitting: Sports Medicine

## 2017-12-28 DIAGNOSIS — Z125 Encounter for screening for malignant neoplasm of prostate: Secondary | ICD-10-CM | POA: Diagnosis not present

## 2017-12-28 DIAGNOSIS — Z0000 Encounter for general adult medical examination without abnormal findings: Secondary | ICD-10-CM | POA: Diagnosis not present

## 2017-12-28 DIAGNOSIS — E7849 Other hyperlipidemia: Secondary | ICD-10-CM | POA: Diagnosis not present

## 2018-01-03 ENCOUNTER — Ambulatory Visit: Payer: BLUE CROSS/BLUE SHIELD | Admitting: Sports Medicine

## 2018-01-03 ENCOUNTER — Encounter: Payer: Self-pay | Admitting: Sports Medicine

## 2018-01-03 DIAGNOSIS — Z1212 Encounter for screening for malignant neoplasm of rectum: Secondary | ICD-10-CM | POA: Diagnosis not present

## 2018-01-03 DIAGNOSIS — M75102 Unspecified rotator cuff tear or rupture of left shoulder, not specified as traumatic: Secondary | ICD-10-CM | POA: Diagnosis not present

## 2018-01-03 DIAGNOSIS — M12812 Other specific arthropathies, not elsewhere classified, left shoulder: Secondary | ICD-10-CM

## 2018-01-03 NOTE — Assessment & Plan Note (Signed)
Repaired 4/18  He has done well with good strength Motion should continue to improve Reassured and advised on HEP

## 2018-01-03 NOTE — Progress Notes (Signed)
HPI  CC: Left shoulder stiffness, left knee pain  Patient is a 62 year old male who presents for follow up of left shoulder stiffness and left knee pain.  He underwent rotator cuff repair in April of 2018 with Dr. Earnestine Mealing.  Since that time, he has been rehabilitating the shoulder with band exercises and physical therapy.  He has been doing his exercises are prescribed daily.  He is now playing tennis 2-3 times weekly, at a level he describes as 95%.  He states the only time his shoulder bothers him is when he goes to do an overhead serve.  He states he normally feels a tightness in the area.  There is occasionally an associated pain.  He denies any numbness or tingling in the area.  He has not experienced any weakness in the area.  He states he had some improvement in the pain when he changed the tension of his tennis racket and loosened his grip.  He states the stiffness has been unchanged for the last 2-3 months, but has improved since his initial surgery over a year ago.  He is taking NSAIDs as needed.  He also has known OA in his left knee.  He states he has been unaffected by pain in his knee for the most part.  He is concerned because he still has some swelling in the area.  He has been doing quadriceps exercises, closed kinetic chain with light weight, for the past two months.  He states he has noticed a different in strength and in the swelling.  He states he has not had much pain in the area.  He denies any numbness, tingling, or weakness in the area.  See HPI and/or previous note for associated ROS.  Objective: BP 118/82   Ht 6' (1.829 m)   Wt 204 lb (92.5 kg)   BMI 27.67 kg/m  Gen: left-Hand Dominant. NAD, well groomed, a/o x3, normal affect.  CV: Well-perfused. Warm.  Resp: Non-labored.  Neuro: Sensation intact throughout. No gross coordination deficits.  Gait: Nonpathologic posture, unremarkable stride without signs of limp or balance issues.  Shoulder, left:. No evidence of  bony deformity, asymmetry, or muscle atrophy; No tenderness over long head of biceps (bicipital groove). No TTP at [facility] joint. Full active and passive range of motion, with the exception of slightly limited extension (around 10 degrees). Thumb to T12 without significant tenderness. Strength 5/5 throughout. No abnormal scapular function observed. Sensation intact. Peripheral pulses intact.  Special Tests:   - Crossarm test: NEG   - Empty can: NEG   - Hawkins: NEG   - Neer test: NEG   - Obrien's test: NEG   - Yergason's: NEG   - Speeds test: NEG   - Crank test: NEG   - Apprehension test: NEG  Knee, left: Inspection was negative for erythema or ecchymosis.  There was some mild joint effusion. No obvious bony abnormalities or signs of osteophyte development. Palpation yielded no asymmetric warmth; No joint line tenderness; No condyle tenderness; No patellar tenderness; There was patellar crepitus on ROM. Patellar and quadriceps tendons unremarkable, and no tenderness of the pes anserine bursa. No obvious Baker's cyst development. ROM normal in flexion (135 degrees) and extension (0 degrees). Normal hamstring and quadriceps strength. Neurovascularly intact bilaterally.  Negative Lachman, posterior drawer, valgus/varus testing, and McMurray.   Assessment and plan:  1. Left shoulder stiffness  This is likely secondary to patient's rotator cuff repair in April of 2018.  We advised patient  that this is likely going to take around 2 years for rotator cuff to fully recover.  Continue with band exercises for strengthening and stretching of the area.  We have provided patient with some additional stretches today to assist with tightness in his overhead serve. Changes in service motion and overhead advised   RTC as needed for follow up.  2. Left Knee effusion  This is likely secondary to patient's underlying OA in affected knee.  Given his active life style, he was advised he will likely experience flares  like this in the future.  Continue with NSAIDs as needed for pain relief.  Continue with quadricep strengthening exercises.  Continue with ice and compression as needed.  Continue using voltaren cream as needed.  RTC as needed for follow up.  Lewanda Rife, MD [facility] 01/03/2018 1:34 PM I observed and examined the patient with the resident and agree with assessment and plan.  Note reviewed and modified by me. Stefanie Libel, MD

## 2018-01-04 DIAGNOSIS — Z0000 Encounter for general adult medical examination without abnormal findings: Secondary | ICD-10-CM | POA: Diagnosis not present

## 2018-01-04 DIAGNOSIS — J3089 Other allergic rhinitis: Secondary | ICD-10-CM | POA: Diagnosis not present

## 2018-01-04 DIAGNOSIS — E663 Overweight: Secondary | ICD-10-CM | POA: Diagnosis not present

## 2018-01-04 DIAGNOSIS — Z1389 Encounter for screening for other disorder: Secondary | ICD-10-CM | POA: Diagnosis not present

## 2018-01-04 DIAGNOSIS — M1712 Unilateral primary osteoarthritis, left knee: Secondary | ICD-10-CM | POA: Diagnosis not present

## 2018-01-04 DIAGNOSIS — N401 Enlarged prostate with lower urinary tract symptoms: Secondary | ICD-10-CM | POA: Diagnosis not present

## 2018-01-17 ENCOUNTER — Encounter: Payer: BLUE CROSS/BLUE SHIELD | Admitting: Internal Medicine

## 2018-03-02 ENCOUNTER — Encounter: Payer: Self-pay | Admitting: Internal Medicine

## 2018-03-02 ENCOUNTER — Other Ambulatory Visit: Payer: Self-pay

## 2018-03-02 ENCOUNTER — Ambulatory Visit (AMBULATORY_SURGERY_CENTER): Payer: Self-pay | Admitting: *Deleted

## 2018-03-02 VITALS — Ht 71.0 in | Wt 208.4 lb

## 2018-03-02 DIAGNOSIS — Z86010 Personal history of colonic polyps: Secondary | ICD-10-CM

## 2018-03-02 MED ORDER — SUPREP BOWEL PREP KIT 17.5-3.13-1.6 GM/177ML PO SOLN: 1.0000 | Freq: Once | ORAL | 0 refills | Status: AC

## 2018-03-02 NOTE — Progress Notes (Signed)
Patient denies any allergies to egg or soy products. Patient denies complications with anesthesia/sedation.  Patient denies oxygen use at home and denies diet medications. Patient denies information on colonoscopy. 

## 2018-03-13 ENCOUNTER — Ambulatory Visit (AMBULATORY_SURGERY_CENTER): Payer: BLUE CROSS/BLUE SHIELD | Admitting: Internal Medicine

## 2018-03-13 ENCOUNTER — Encounter: Payer: Self-pay | Admitting: Internal Medicine

## 2018-03-13 ENCOUNTER — Other Ambulatory Visit: Payer: Self-pay

## 2018-03-13 VITALS — BP 105/70 | HR 63 | Temp 97.1°F | Resp 18 | Ht 71.0 in | Wt 208.0 lb

## 2018-03-13 DIAGNOSIS — Z86010 Personal history of colonic polyps: Secondary | ICD-10-CM

## 2018-03-13 MED ORDER — SODIUM CHLORIDE 0.9 % IV SOLN: 500.0000 mL | Freq: Once | INTRAVENOUS | Status: DC

## 2018-03-13 NOTE — Patient Instructions (Signed)
   Thank you for allowing [place] to care for you today!  Resume previous diet and medications today.  Return to normal activities tomorrow.  Repeat colonoscopy in 5 years for surveillance.  Handout provided for diverticulosis.     YOU HAD AN ENDOSCOPIC PROCEDURE TODAY AT [facility]:   Refer to the procedure report that was given to you for any specific questions about what was found during the examination.  If the procedure report does not answer your questions, please call your gastroenterologist to clarify.  If you requested that your care partner not be given the details of your procedure findings, then the procedure report has been included in a sealed envelope for you to review at your convenience later.  YOU SHOULD EXPECT: Some feelings of bloating in the abdomen. Passage of more gas than usual.  Walking can help get rid of the air that was put into your GI tract during the procedure and reduce the bloating. If you had a lower endoscopy (such as a colonoscopy or flexible sigmoidoscopy) you may notice spotting of blood in your stool or on the toilet paper. If you underwent a bowel prep for your procedure, you may not have a normal bowel movement for a few days.  Please Note:  You might notice some irritation and congestion in your nose or some drainage.  This is from the oxygen used during your procedure.  There is no need for concern and it should clear up in a day or so.  SYMPTOMS TO REPORT IMMEDIATELY:   Following lower endoscopy (colonoscopy or flexible sigmoidoscopy):  Excessive amounts of blood in the stool  Significant tenderness or worsening of abdominal pains  Swelling of the abdomen that is new, acute  Fever of 100F or higher    For urgent or emergent issues, a gastroenterologist can be reached at any hour by calling [phone].   DIET:  We do recommend a small meal at first, but then you may proceed to your regular diet.  Drink plenty of fluids but  you should avoid alcoholic beverages for 24 hours.  ACTIVITY:  You should plan to take it easy for the rest of today and you should NOT DRIVE or use heavy machinery until tomorrow (because of the sedation medicines used during the test).    FOLLOW UP: Our staff will call the number listed on your records the next business day following your procedure to check on you and address any questions or concerns that you may have regarding the information given to you following your procedure. If we do not reach you, we will leave a message.  However, if you are feeling well and you are not experiencing any problems, there is no need to return our call.  We will assume that you have returned to your regular daily activities without incident.  If any biopsies were taken you will be contacted by phone or by letter within the next 1-3 weeks.  Please call us at [phone] if you have not heard about the biopsies in 3 weeks.    SIGNATURES/CONFIDENTIALITY: You and/or your care partner have signed paperwork which will be entered into your electronic medical record.  These signatures attest to the fact that that the information above on your After Visit Summary has been reviewed and is understood.  Full responsibility of the confidentiality of this discharge information lies with you and/or your care-partner.

## 2018-03-13 NOTE — Op Note (Signed)
[facility] Patient Name: Jonathan Wagner Procedure Date: 03/13/2018 10:14 AM MRN: [id number] Endoscopist: Docia Chuck. Henrene Pastor , MD Age: 62 Referring MD:  Date of Birth: 08-28-1955 Gender: Male Account #: [id number] Procedure:                Colonoscopy Indications:              High risk colon cancer surveillance: Personal                            history of non-advanced adenomas, High risk colon                            cancer surveillance: Personal history of sessile                            serrated colon polyps (less than 10 mm in size)                            with no dysplasia. Previous examinations 2006 and                            2013 Medicines:                Monitored Anesthesia Care Procedure:                Pre-Anesthesia Assessment:                           - Prior to the procedure, a History and Physical                            was performed, and patient medications and                            allergies were reviewed. The patient's tolerance of                            previous anesthesia was also reviewed. The risks                            and benefits of the procedure and the sedation                            options and risks were discussed with the patient.                            All questions were answered, and informed consent                            was obtained. Prior Anticoagulants: The patient has                            taken no previous anticoagulant or antiplatelet  agents. ASA Grade Assessment: I - A patient with                            mild systemic disease. After reviewing the risks                            and benefits, the patient was deemed in                            satisfactory condition to undergo the procedure.                           After obtaining informed consent, the colonoscope                            was passed under direct vision. Throughout the          procedure, the patient's blood pressure, pulse, and                            oxygen saturations were monitored continuously. The                            Colonoscope was introduced through the anus and                            advanced to the the cecum, identified by                            appendiceal orifice and ileocecal valve. The                            ileocecal valve, appendiceal orifice, and rectum                            were photographed. The quality of the bowel                            preparation was excellent. The colonoscopy was                            performed without difficulty. The patient tolerated                            the procedure well. The bowel preparation used was                            SUPREP. Scope In: 10:23:08 AM Scope Out: 10:35:02 AM Scope Withdrawal Time: 0 hours 9 minutes 55 seconds  Total Procedure Duration: 0 hours 11 minutes 54 seconds  Findings:                 Multiple small-mouthed diverticula were found in  the sigmoid colon.                           Internal hemorrhoids were found during retroflexion.                           The exam was otherwise without abnormality on                            direct and retroflexion views. Complications:            No immediate complications. Estimated blood loss:                            None. Estimated Blood Loss:     Estimated blood loss: none. Impression:               - Diverticulosis in the sigmoid colon.                           - Internal hemorrhoids.                           - The examination was otherwise normal on direct                            and retroflexion views.                           - No specimens collected. Recommendation:           - Repeat colonoscopy in 5 years for surveillance                            (personal history of multiple adenomas and SSPs).                           - Patient has a contact number  available for                            emergencies. The signs and symptoms of potential                            delayed complications were discussed with the                            patient. Return to normal activities tomorrow.                            Written discharge instructions were provided to the                            patient.                           - Resume previous diet.                           -  Continue present medications. Docia Chuck. Henrene Pastor, MD 03/13/2018 10:40:54 AM This report has been signed electronically.

## 2018-03-13 NOTE — Progress Notes (Signed)
To recovery, report to RN, VSS. 

## 2018-03-13 NOTE — Progress Notes (Signed)
Pt's states no medical or surgical changes since previsit or office visit. 

## 2018-03-14 ENCOUNTER — Telehealth: Payer: Self-pay

## 2018-03-14 NOTE — Telephone Encounter (Signed)
Called [phone] and left a messaged we tried to reach pt for a follow up call. maw

## 2018-03-14 NOTE — Telephone Encounter (Signed)
Called [phone] and left a messaged we tried to reach pt for a follow up call. maw

## 2018-04-04 ENCOUNTER — Ambulatory Visit: Payer: BLUE CROSS/BLUE SHIELD | Admitting: Sports Medicine

## 2018-04-06 ENCOUNTER — Ambulatory Visit: Payer: BLUE CROSS/BLUE SHIELD | Admitting: Sports Medicine

## 2018-04-27 DIAGNOSIS — H2513 Age-related nuclear cataract, bilateral: Secondary | ICD-10-CM | POA: Diagnosis not present

## 2018-04-27 DIAGNOSIS — H5213 Myopia, bilateral: Secondary | ICD-10-CM | POA: Diagnosis not present

## 2018-04-27 DIAGNOSIS — H1851 Endothelial corneal dystrophy: Secondary | ICD-10-CM | POA: Diagnosis not present

## 2018-05-31 DIAGNOSIS — N401 Enlarged prostate with lower urinary tract symptoms: Secondary | ICD-10-CM | POA: Diagnosis not present

## 2018-05-31 DIAGNOSIS — R351 Nocturia: Secondary | ICD-10-CM | POA: Diagnosis not present

## 2018-06-02 DIAGNOSIS — D1722 Benign lipomatous neoplasm of skin and subcutaneous tissue of left arm: Secondary | ICD-10-CM | POA: Diagnosis not present

## 2018-06-02 DIAGNOSIS — L814 Other melanin hyperpigmentation: Secondary | ICD-10-CM | POA: Diagnosis not present

## 2018-06-02 DIAGNOSIS — L821 Other seborrheic keratosis: Secondary | ICD-10-CM | POA: Diagnosis not present

## 2018-06-02 DIAGNOSIS — L570 Actinic keratosis: Secondary | ICD-10-CM | POA: Diagnosis not present

## 2018-06-02 DIAGNOSIS — D225 Melanocytic nevi of trunk: Secondary | ICD-10-CM | POA: Diagnosis not present

## 2018-06-02 DIAGNOSIS — D2262 Melanocytic nevi of left upper limb, including shoulder: Secondary | ICD-10-CM | POA: Diagnosis not present

## 2018-06-06 DIAGNOSIS — N5201 Erectile dysfunction due to arterial insufficiency: Secondary | ICD-10-CM | POA: Diagnosis not present

## 2018-06-06 DIAGNOSIS — R3912 Poor urinary stream: Secondary | ICD-10-CM | POA: Diagnosis not present

## 2018-06-06 DIAGNOSIS — N401 Enlarged prostate with lower urinary tract symptoms: Secondary | ICD-10-CM | POA: Diagnosis not present

## 2018-06-06 DIAGNOSIS — R972 Elevated prostate specific antigen [PSA]: Secondary | ICD-10-CM | POA: Diagnosis not present

## 2018-11-06 DIAGNOSIS — R1031 Right lower quadrant pain: Secondary | ICD-10-CM | POA: Diagnosis not present

## 2018-11-06 DIAGNOSIS — N4340 Spermatocele of epididymis, unspecified: Secondary | ICD-10-CM | POA: Diagnosis not present

## 2018-11-20 DIAGNOSIS — R102 Pelvic and perineal pain: Secondary | ICD-10-CM | POA: Diagnosis not present

## 2018-11-20 DIAGNOSIS — N5201 Erectile dysfunction due to arterial insufficiency: Secondary | ICD-10-CM | POA: Diagnosis not present

## 2018-11-20 DIAGNOSIS — N4341 Spermatocele of epididymis, single: Secondary | ICD-10-CM | POA: Diagnosis not present

## 2018-11-30 ENCOUNTER — Other Ambulatory Visit: Payer: Self-pay

## 2018-11-30 ENCOUNTER — Encounter: Payer: Self-pay | Admitting: Sports Medicine

## 2018-11-30 ENCOUNTER — Ambulatory Visit: Payer: BC Managed Care – PPO | Admitting: Sports Medicine

## 2018-11-30 ENCOUNTER — Ambulatory Visit
Admission: RE | Admit: 2018-11-30 | Discharge: 2018-11-30 | Disposition: A | Discharge status: Home / Self Care | Payer: BC Managed Care – PPO | Source: Ambulatory Visit | Attending: Sports Medicine | Admitting: Sports Medicine

## 2018-11-30 VITALS — BP 100/62 | Ht 72.0 in | Wt 195.0 lb

## 2018-11-30 DIAGNOSIS — R1084 Generalized abdominal pain: Secondary | ICD-10-CM | POA: Diagnosis not present

## 2018-11-30 DIAGNOSIS — R109 Unspecified abdominal pain: Secondary | ICD-10-CM | POA: Diagnosis not present

## 2018-11-30 DIAGNOSIS — M869 Osteomyelitis, unspecified: Secondary | ICD-10-CM

## 2018-11-30 NOTE — Patient Instructions (Signed)
Osteitis pubis

## 2018-11-30 NOTE — Progress Notes (Signed)
  Kamari Buch - 63 y.o. male MRN [id number]  Date of birth: 19-Apr-1956    SUBJECTIVE:      Chief Complaint: Lower abdominal pain  HPI:  63 year old male with lower abdominal/pubic symphysis pain for 1.5 months.  Patient denies any injury.  He states that he has been walking 3-4.5 miles per day and notices pain near the end of his walk.  It improves after 2 to 3 hours of rest.  It consistently returns with activity.  He states that recently he played tennis and had to withdraw from playing due to his pain.  He localizes pain bilaterally somewhat near the pubic ramus.  It does not radiate into the scrotum or up into the abdomen.  He denies any dysuria or hematuria.  No bowel symptoms.  He saw his urologist last week who did a thorough examination and ruled out hernia or testicular etiology.  Patient reports he had a urinalysis performed which showed no infection.  ROS:     See HPI. All other reviewed systems negative.  PERTINENT  PMH / PSH FH / / SH:  Past Medical, Surgical, Social, and Family History Reviewed & Updated in the EMR.    OBJECTIVE: BP 100/62   Ht 6' (1.829 m)   Wt 195 lb (88.5 kg)   BMI 26.45 kg/m   Physical Exam:  Vital signs are reviewed.  GEN: Alert and oriented, NAD Pulm: Breathing unlabored Abdomen: No abdominal tenderness.  No rigidity or rebound PSY: normal mood, congruent affect  MSK: Right hip:  - Inspection: No gross deformity, no swelling, erythema, or ecchymosis - Palpation: Tenderness over the pubic rami bilaterally - ROM: Normal range of motion on Flexion.  Very slightly decreased internal rotation but without pain.  Normal external rotation - Strength: Normal strength with hip flexion and adduction without pain - Neuro/vasc: NV intact distally - Special Tests: Patient reports anterior pain/tightness with FABER around the proximal adductors.  Negative FADIR.  Negative logroll.   Left Hip: - Inspection: No gross deformity, no swelling, erythema, or  ecchymosis - Palpation: Tenderness over the pubic rami bilaterally - ROM: Normal range of motion on Flexion.  Very slightly decreased internal rotation but without pain.  Normal external rotation  - Strength: Normal strength with hip flexion and adduction without pain - Neuro/vasc: NV intact distally - Special Tests: Negative FABER.  Negative FADIR.  Negative logroll.      ASSESSMENT & PLAN:  1.  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  He has seen his urologist who has ruled out GU etiology or hernia. -Patient instructed on home rehab exercises to include adduction and abduction strengthening, standing hip rotator, and crutches. - Anti-inflammatories as needed - She was tolerated -We will obtain AP pelvis x-rays to further evaluate -Follow-up pending x-rays  AP Pelvis Mild sclerosis and some irregularity consistent with early osteitis pubis Ila Mcgill, MD  I observed and examined the patient with Dr. Okey Dupre and agree with assessment and plan.  Note reviewed and modified by me. We will try conservative care and aleve. Reck 1 month Ila Mcgill, MD

## 2018-12-28 ENCOUNTER — Ambulatory Visit: Payer: BC Managed Care – PPO | Admitting: Sports Medicine

## 2018-12-28 ENCOUNTER — Other Ambulatory Visit: Payer: Self-pay

## 2018-12-28 DIAGNOSIS — R1030 Lower abdominal pain, unspecified: Secondary | ICD-10-CM

## 2018-12-28 NOTE — Progress Notes (Signed)
CC; Osteitis pubis pain  After 2 weeks of exercises clearly much better No pain with walking 2 miles or hitting tennis balls Tried pushing walk to 3.2 miles and had some pain again [employer] and a lot of pain by 3rd set  Doing a good job on most of exercises Still needs wall support for sxt rot of hip   ROS Not much pain with cough or sneeze now No pain walking steps Pain overall > 50% less  PE NAD, athletic older man BP 104/64   Ht 6' (1.829 m)   Wt 195 lb (88.5 kg)   BMI 26.45 kg/m   Hip ROM - norm L and R FABER Improved 20 deg on RT/ norm L Excellent strength abduction, adduction and flexion Difficulty doing standing hip rotation Back extension does not cause pain

## 2018-12-28 NOTE — Assessment & Plan Note (Signed)
Really improved with HEP This is almost 4 weeks but advised that this takes months to heal Keep working on hip rotation and maintain strength work  OK to hit and walk but moderate intensity and distance  Reck 2 mos

## 2018-12-28 NOTE — Patient Instructions (Signed)
Keep up core series kep up hip exercises Add more of the standing hip rotation Develop ability to do this on 1 leg without wall  Limit total distance walking Tennis - limit speed  Not too much arch in serve  See me in 4 to 6 weeks

## 2019-01-01 DIAGNOSIS — E7849 Other hyperlipidemia: Secondary | ICD-10-CM | POA: Diagnosis not present

## 2019-01-01 DIAGNOSIS — Z125 Encounter for screening for malignant neoplasm of prostate: Secondary | ICD-10-CM | POA: Diagnosis not present

## 2019-01-01 DIAGNOSIS — Z0000 Encounter for general adult medical examination without abnormal findings: Secondary | ICD-10-CM | POA: Diagnosis not present

## 2019-01-02 DIAGNOSIS — R82998 Other abnormal findings in urine: Secondary | ICD-10-CM | POA: Diagnosis not present

## 2019-01-08 DIAGNOSIS — M179 Osteoarthritis of knee, unspecified: Secondary | ICD-10-CM | POA: Diagnosis not present

## 2019-01-08 DIAGNOSIS — M8538 Osteitis condensans, other site: Secondary | ICD-10-CM | POA: Diagnosis not present

## 2019-01-08 DIAGNOSIS — N4340 Spermatocele of epididymis, unspecified: Secondary | ICD-10-CM | POA: Diagnosis not present

## 2019-01-08 DIAGNOSIS — E785 Hyperlipidemia, unspecified: Secondary | ICD-10-CM | POA: Diagnosis not present

## 2019-01-08 DIAGNOSIS — Z0000 Encounter for general adult medical examination without abnormal findings: Secondary | ICD-10-CM | POA: Diagnosis not present

## 2019-01-25 ENCOUNTER — Other Ambulatory Visit: Payer: Self-pay

## 2019-01-25 ENCOUNTER — Ambulatory Visit: Payer: BC Managed Care – PPO | Admitting: Sports Medicine

## 2019-01-25 DIAGNOSIS — R1030 Lower abdominal pain, unspecified: Secondary | ICD-10-CM

## 2019-01-25 NOTE — Assessment & Plan Note (Signed)
No typical hernia noted  However, unusual to have any swelling in osteitis pubis  Ask Dr. Lucia Gaskins to evaluate for fascial or other atypical hernia

## 2019-01-25 NOTE — Progress Notes (Signed)
PCP: Burnard Bunting, MD  Subjective:  CC: Right deep groin pain and swelling  HPI: Patient is a 63 y.o. male here for follow up for right inguinal pain previously described as osteitis pubis. Since his last visit he has been resting and doing no other exercises other than some core-strengthening exercises and occasionally mowing his lawn. About 2 weeks ago, however, he was doing some core work and quickly developed right groin swelling and pain. The swelling and pain decreases with applying ice to the inguinal region and resting.  The patient is most concerned that even light exercise, such as mowing the lawn causes the inguinal region to swell and have pain.  The pain stays around 3 out of 10.  Otherwise he denies fevers, headaches, shortness of breath, chest pain, abdominal pains, constipation, diarrhea dysuria, scrotal swelling, and pain rating into either leg.   Past Medical History:  Diagnosis Date  . Arthritis    knees - mild  . BPH (benign prostatic hyperplasia)     Current Outpatient Medications on File Prior to Visit  Medication Sig Dispense Refill  . diclofenac sodium (VOLTAREN) 1 % GEL Apply 2 g topically 4 (four) times daily. (Patient not taking: Reported on 03/02/2018) 100 g 1  . finasteride (PROSCAR) 5 MG tablet Take 5 mg by mouth daily.    . Multiple Vitamins-Minerals (MULTIVITAMIN WITH MINERALS) tablet Take 1 tablet by mouth daily.    . Probiotic Product (PROBIOTIC DAILY PO) Take by mouth.    . vitamin C (ASCORBIC ACID) 250 MG tablet Take 250 mg by mouth daily.     No current facility-administered medications on file prior to visit.     Past Surgical History:  Procedure Laterality Date  . COLONOSCOPY  2006, 2013   Henrene Pastor - polyps  . FATTY TUMOR     REMOVED AS CHILD  . POLYPECTOMY  12/04/2004  . SHOULDER SURGERY Left   . WISDOM TOOTH EXTRACTION      No Known Allergies  Social History   Socioeconomic History  . Marital status: Married    Spouse name: Mariann Laster  .  Number of children: Not on file  . Years of education: Not on file  . Highest education level: Not on file  Occupational History  . Not on file  Social Needs  . Financial resource strain: Not on file  . Food insecurity    Worry: Not on file    Inability: Not on file  . Transportation needs    Medical: Not on file    Non-medical: Not on file  Tobacco Use  . Smoking status: Never Smoker  . Smokeless tobacco: Never Used  Substance and Sexual Activity  . Alcohol use: Yes    Alcohol/week: 16.0 standard drinks    Types: 12 Standard drinks or equivalent, 4 Glasses of wine per week    Comment: DRINK A 12 PACK BEER PER WEEK  . Drug use: No  . Sexual activity: Yes  Lifestyle  . Physical activity    Days per week: Not on file    Minutes per session: Not on file  . Stress: Not on file  Relationships  . Social Herbalist on phone: Not on file    Gets together: Not on file    Attends religious service: Not on file    Active member of club or organization: Not on file    Attends meetings of clubs or organizations: Not on file    Relationship status: Not on  file  . Intimate partner violence    Fear of current or ex partner: Not on file    Emotionally abused: Not on file    Physically abused: Not on file    Forced sexual activity: Not on file  Other Topics Concern  . Not on file  Social History Narrative  . Not on file    Family History  Problem Relation Age of Onset  . Breast cancer Mother   . Prostate cancer Father   . Colon cancer Neg Hx   . Rectal cancer Neg Hx   . Stomach cancer Neg Hx     BP 100/60   Wt 195 lb (88.5 kg)   BMI 26.45 kg/m   Review of Systems: See HPI above.     Objective:  Physical Exam: BP 100/60   Wt 195 lb (88.5 kg)   BMI 26.45 kg/m   Gen: NAD, comfortable in exam room Respiratory: Normal work of breathing, speaking in complete sentences Abdomen: No tenderness to palpation Right inguinal region: No gross deformity, mass or  swelling appreciated, tenderness elicited to palpation along and above inguinal ligament, 5/5 (improved) strength to left hip flexion adduction and abduction; no tenderness elicited to strength-testing exercises, negative FABER and FADIR testing to right hip, negative log roll  No inguinal hernia noted Mild TTP over fem. Triangle but no bulging   Assessment & Plan:  1.  Right inguinal pain: Due to history of severe swelling and pain with weightbearing exercise differential includes osteitis pubis versus atypical hernia.  With FADIR testing negative suspicion for left hip arthritis is low -Patient strength improving with exercises -Patient encouraged to reduce physical activity -Patient instructed to capture image of right inguinal swelling during flareup -Patient will be referred to [place] surgery regarding work-up for hernia of inguinal region -Tylenol, ibuprofen PRN pain  Milus Banister, [place], PGY-2 01/25/2019 10:18 PM   I observed and examined the patient with the resident and agree with assessment and plan.  Note reviewed and modified by me. Ila Mcgill, MD

## 2019-01-29 NOTE — Patient Instructions (Signed)
[facility]ons[facility] Overall [address] [place]  [place] [address] [phone] Wednesday 02/14/19 at 1030a Arrival time is 10a

## 2019-02-08 DIAGNOSIS — R1031 Right lower quadrant pain: Secondary | ICD-10-CM | POA: Diagnosis not present

## 2019-02-08 DIAGNOSIS — N5089 Other specified disorders of the male genital organs: Secondary | ICD-10-CM | POA: Diagnosis not present

## 2019-02-14 DIAGNOSIS — N4341 Spermatocele of epididymis, single: Secondary | ICD-10-CM | POA: Diagnosis not present

## 2019-03-09 DIAGNOSIS — Z1159 Encounter for screening for other viral diseases: Secondary | ICD-10-CM | POA: Diagnosis not present

## 2019-03-13 DIAGNOSIS — K4090 Unilateral inguinal hernia, without obstruction or gangrene, not specified as recurrent: Secondary | ICD-10-CM | POA: Diagnosis not present

## 2019-03-13 DIAGNOSIS — N5089 Other specified disorders of the male genital organs: Secondary | ICD-10-CM | POA: Diagnosis not present

## 2019-03-20 IMAGING — CR DG KNEE COMPLETE 4+V*R*
4 series · 4 of 4 positions shown · non-contrast
Comparison: None in PACs

CLINICAL DATA: Posterior knee pain greater on the left than on the
right. No known injury.

EXAM:
RIGHT KNEE - COMPLETE 4+ VIEW

[t knee ap right]
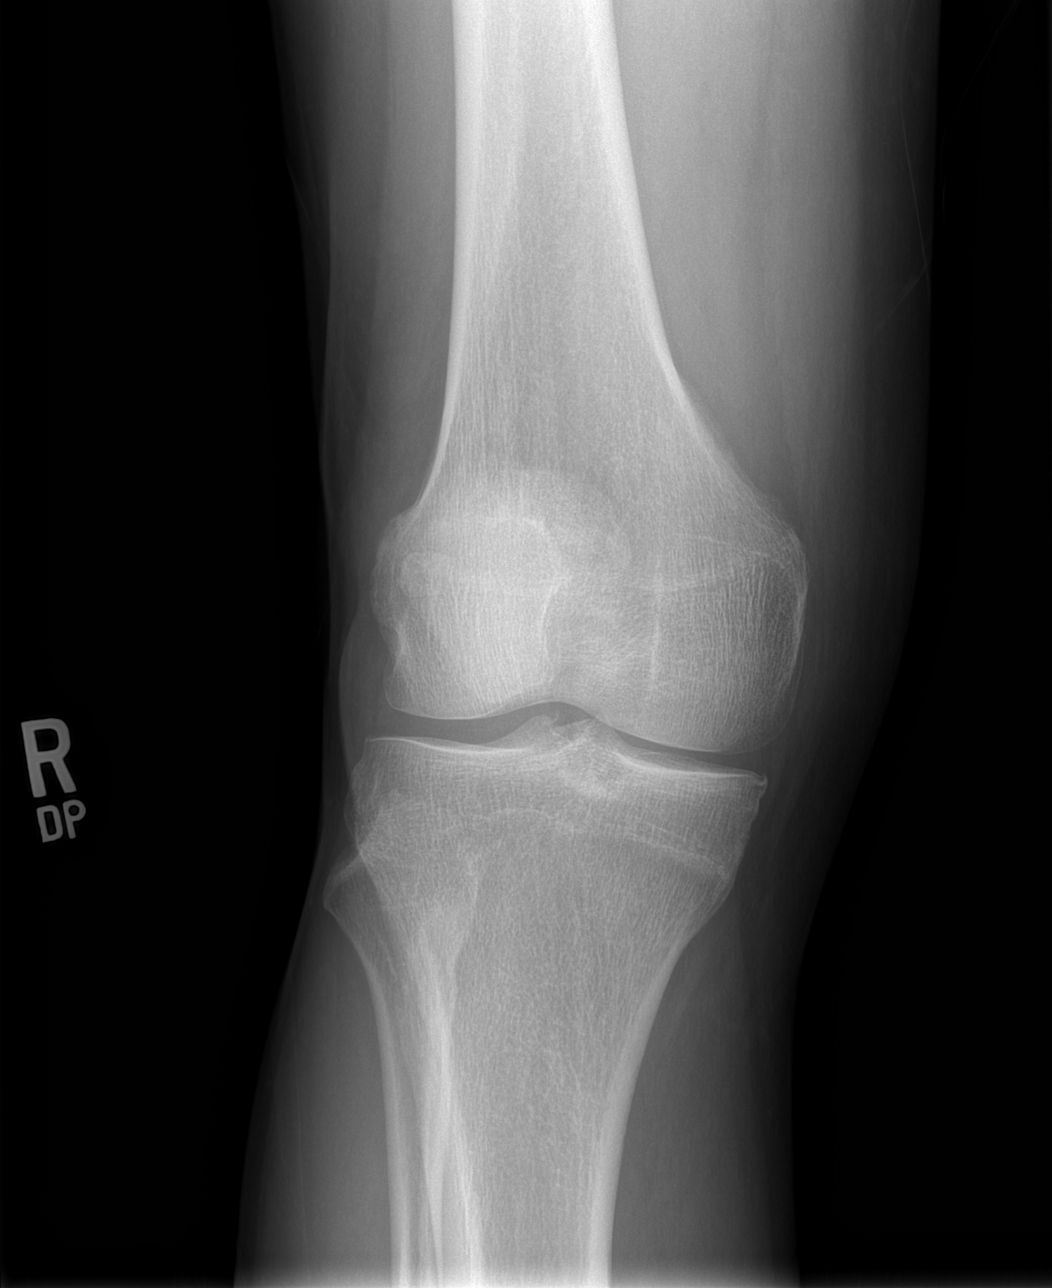

[t knee oblique right (1 of 2)]
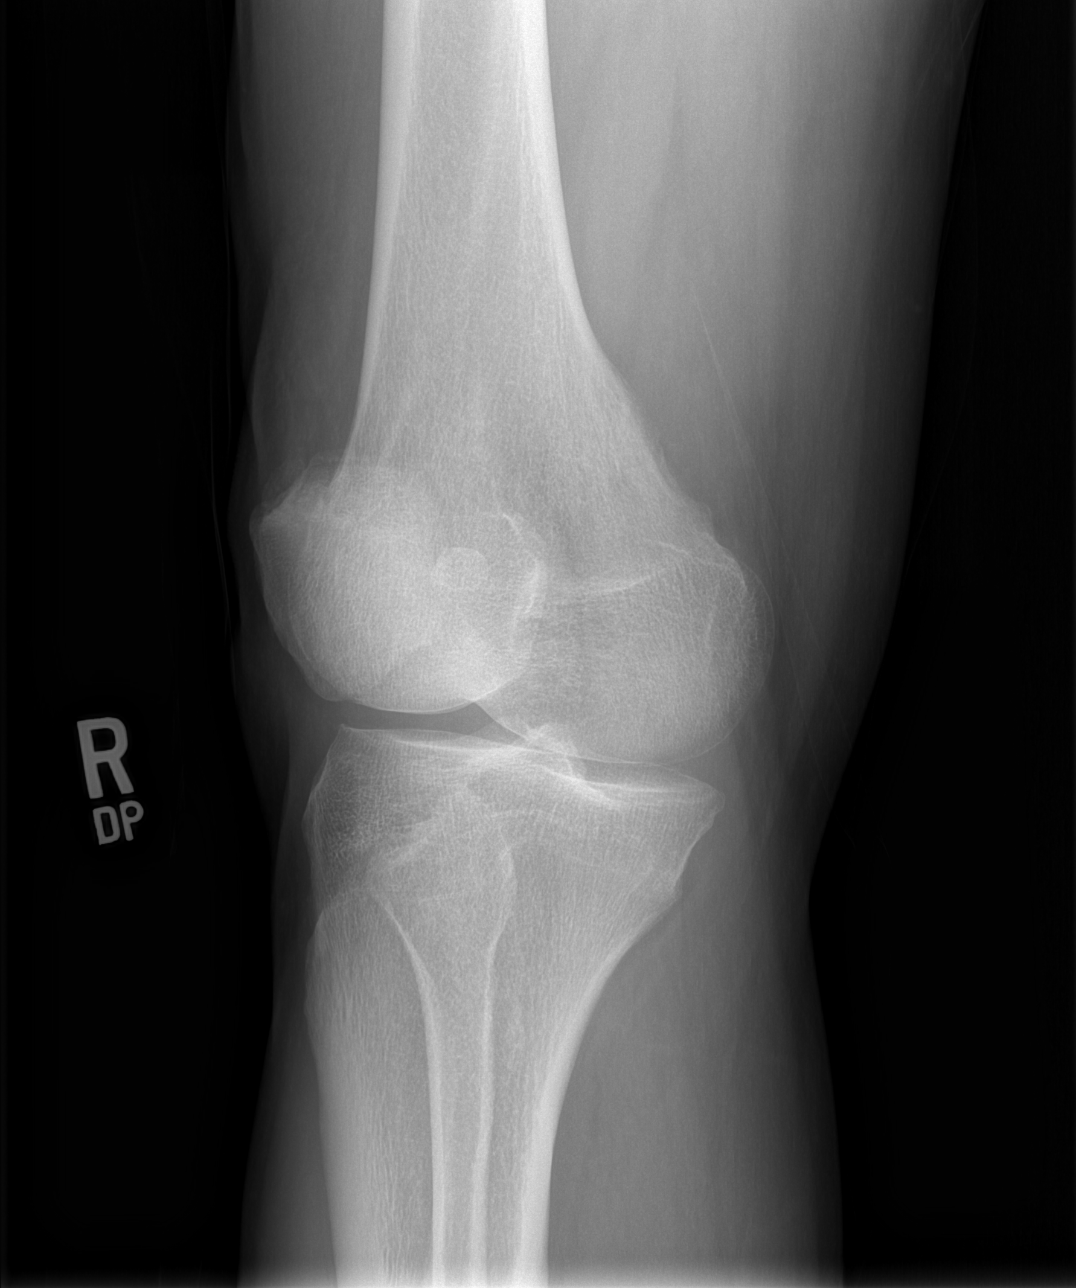

[t knee oblique right (2 of 2)]
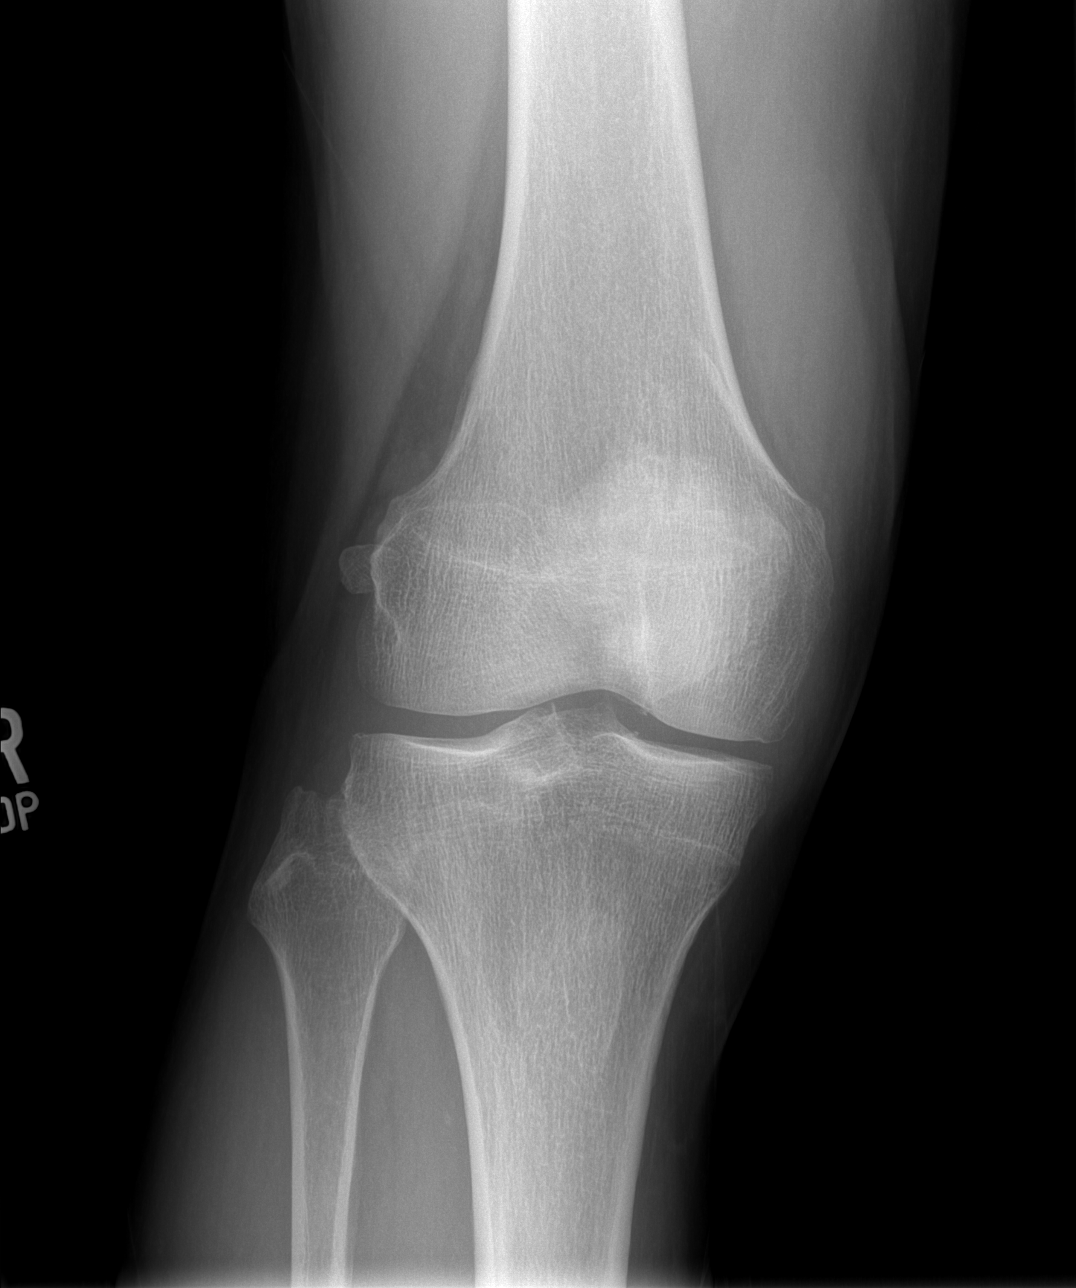

[t knee lat right]
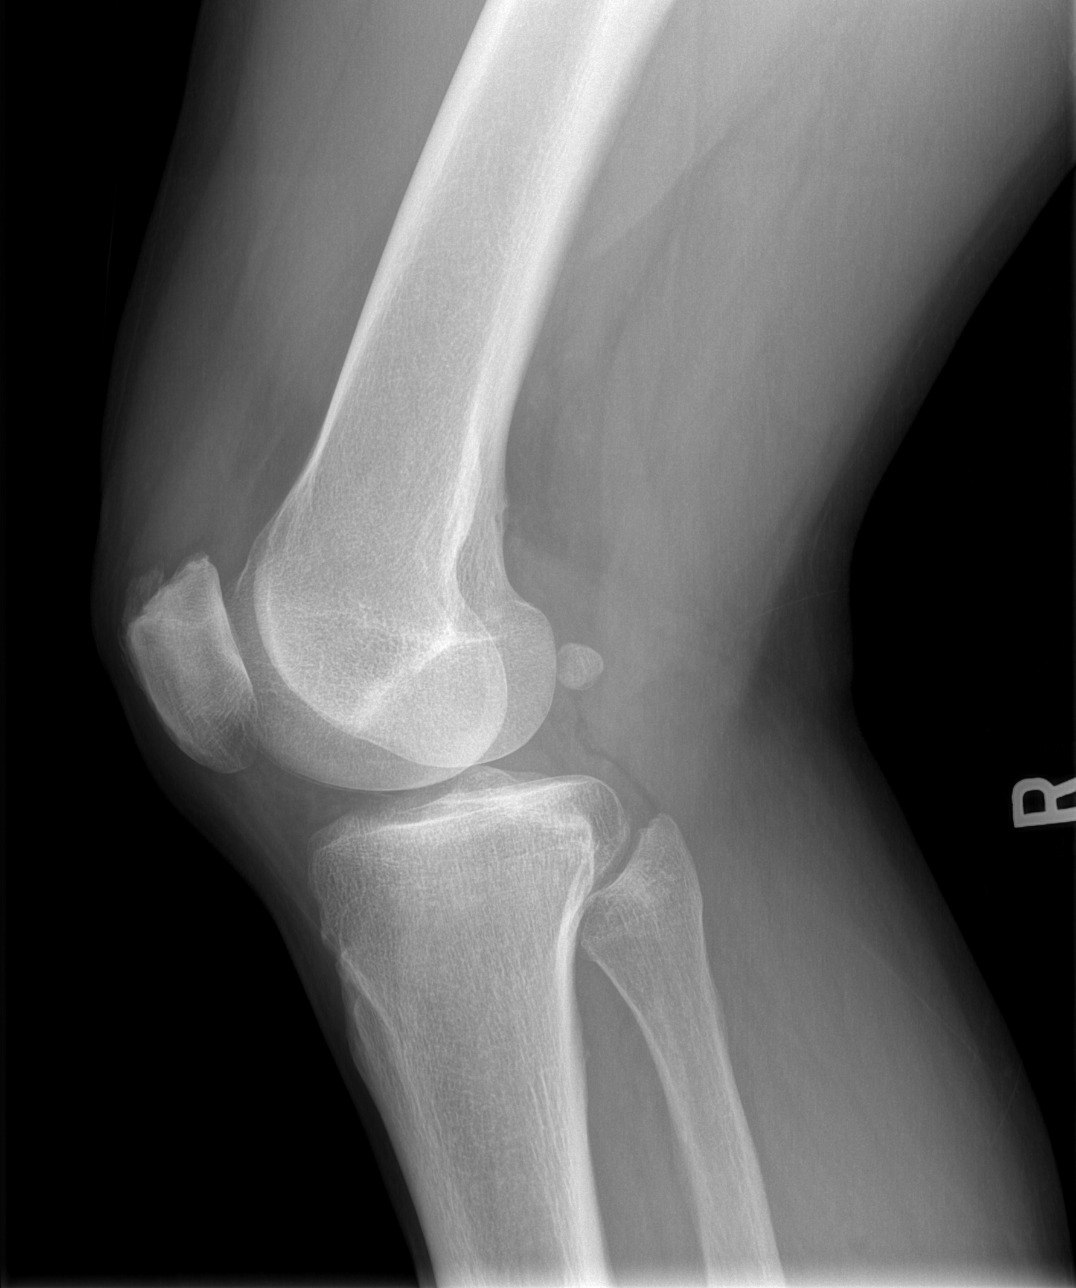

[4 of 4 positions shown; findings below may reference images not displayed]

FINDINGS: The bones are subjectively adequately mineralized. There is very
mild loss of height of the medial joint space. There beaking of the
tibial spines. There no acute or healing fracture. There is no joint
effusion.
IMPRESSION: Mild osteoarthritic change centered on the tibial spines and medial
compartment. There is no acute bony abnormality.

## 2019-05-01 ENCOUNTER — Other Ambulatory Visit: Payer: Self-pay

## 2019-05-01 ENCOUNTER — Telehealth: Payer: Self-pay | Admitting: Internal Medicine

## 2019-05-01 MED ORDER — HYDROCORTISONE ACETATE 25 MG RE SUPP: 25.0000 mg | Freq: Every day | RECTAL | 0 refills | Status: DC

## 2019-05-01 NOTE — Telephone Encounter (Signed)
Pt played indoor tennis last night and he thinks he may have overdone it, today he has an external hemorrhoid that is quite uncomfortable. He has tried to push it back in but it will not stay. Requesting something be called in for him. He did use a supp that his wife had and it helped a little. Please advise.

## 2019-05-01 NOTE — Telephone Encounter (Signed)
1.  Anusol HC suppositories or the equivalent.  1 at night as needed.  Dispense 30 2.  Sitz bath 20 to 30 minutes daily 3.  Metamucil 1 to 2 tablespoons in 12 to 14 ounces of water or juice to improve stool consistency and take pressure off the hemorrhoid plexus

## 2019-05-01 NOTE — Telephone Encounter (Signed)
Pt aware and script sent to pharmacy. Pt wanted to know what he could use for discomfort, let him know he can use recticare that is available OTC.

## 2019-05-02 DIAGNOSIS — H5213 Myopia, bilateral: Secondary | ICD-10-CM | POA: Diagnosis not present

## 2019-05-02 DIAGNOSIS — H2513 Age-related nuclear cataract, bilateral: Secondary | ICD-10-CM | POA: Diagnosis not present

## 2019-05-29 DIAGNOSIS — D225 Melanocytic nevi of trunk: Secondary | ICD-10-CM | POA: Diagnosis not present

## 2019-05-29 DIAGNOSIS — R351 Nocturia: Secondary | ICD-10-CM | POA: Diagnosis not present

## 2019-05-29 DIAGNOSIS — L309 Dermatitis, unspecified: Secondary | ICD-10-CM | POA: Diagnosis not present

## 2019-05-29 DIAGNOSIS — N401 Enlarged prostate with lower urinary tract symptoms: Secondary | ICD-10-CM | POA: Diagnosis not present

## 2019-05-29 DIAGNOSIS — L821 Other seborrheic keratosis: Secondary | ICD-10-CM | POA: Diagnosis not present

## 2019-05-29 DIAGNOSIS — L570 Actinic keratosis: Secondary | ICD-10-CM | POA: Diagnosis not present

## 2019-05-29 DIAGNOSIS — L218 Other seborrheic dermatitis: Secondary | ICD-10-CM | POA: Diagnosis not present

## 2019-06-05 DIAGNOSIS — R972 Elevated prostate specific antigen [PSA]: Secondary | ICD-10-CM | POA: Diagnosis not present

## 2019-06-05 DIAGNOSIS — N401 Enlarged prostate with lower urinary tract symptoms: Secondary | ICD-10-CM | POA: Diagnosis not present

## 2019-06-05 DIAGNOSIS — N4341 Spermatocele of epididymis, single: Secondary | ICD-10-CM | POA: Diagnosis not present

## 2019-06-05 DIAGNOSIS — R351 Nocturia: Secondary | ICD-10-CM | POA: Diagnosis not present

## 2019-07-16 DIAGNOSIS — E785 Hyperlipidemia, unspecified: Secondary | ICD-10-CM | POA: Diagnosis not present

## 2019-07-16 DIAGNOSIS — M179 Osteoarthritis of knee, unspecified: Secondary | ICD-10-CM | POA: Diagnosis not present

## 2019-07-16 DIAGNOSIS — N401 Enlarged prostate with lower urinary tract symptoms: Secondary | ICD-10-CM | POA: Diagnosis not present

## 2019-07-16 DIAGNOSIS — Z1331 Encounter for screening for depression: Secondary | ICD-10-CM | POA: Diagnosis not present

## 2020-01-03 ENCOUNTER — Ambulatory Visit: Payer: BC Managed Care – PPO | Admitting: Sports Medicine

## 2020-01-08 ENCOUNTER — Other Ambulatory Visit: Payer: Self-pay

## 2020-01-08 ENCOUNTER — Ambulatory Visit: Payer: BC Managed Care – PPO | Admitting: Sports Medicine

## 2020-01-08 DIAGNOSIS — M2352 Chronic instability of knee, left knee: Secondary | ICD-10-CM

## 2020-01-08 NOTE — Patient Instructions (Addendum)
It was great to meet you today! Thank you for letting me participate in your care!  Today, we discussed your right lateral elbow pain which is most commonly referred to as tennis elbow. Continue your conservative management with activity modification, rest, ice, heat, and use of a brace if needed. Please add in the wrist and forearm exercises we gave you to help rehab.  Your left 1st finger collateral ligament on the inside is partially torn which was verified by ultrasound. We recommend 6 weeks of rest and buddy taping it to the 2nd finger especially if active. You can also try using a padded leather glove.  Return to clinic in 6 weeks.  Be well, Jonathan Rutherford, DO PGY-4, Sports Medicine Fellow [place]

## 2020-01-08 NOTE — Progress Notes (Signed)
° ° °  SUBJECTIVE:   CHIEF COMPLAINT / HPI:   Right Elbow Pain Patient states he has pain on the outside of his elbow and he has had this pain before. It feels just like an episode of lateral epicondylitis that he had previously. He is unsure how this reoccurred but he has been resting it, using OTC medications, and performing home exercises and it is getting better. He states if he squeezes it is tender but not painful and he can move it without pain.  Left 2nd MCP joint pain Patient states for about one month he has had slightly worsening pain in the knuckle of his index finger. He states he is unsure how it happened and has no memory of any inciting or traumatic event. He plays tennis and it is his racket hand but he can play without pain most of the time. He does have a clicking sensation and at times feels like it is "going to get stuck but it doesn't". It is tender when he pushes on the inside of the finger and when hitting a tennis ball a certain way it can elicit sharp pain that resolves quickly. No difficulty gripping items, not dropping items.  PERTINENT  PMH / PSH: None  OBJECTIVE:   BP 122/72    Ht 6' (1.829 m)    Wt 199 lb (90.3 kg)    BMI 26.99 kg/m   MSK Right Elbow: Elbow, Right: Inspection yields no evidence of bony deformity, effusion, erythema, ecchymosis, or rash. Tender to palpation at the lateral epicondyle. Active and passive ROM intact in flexion/extension/supination/pronation. Strength 5/5 throughout. TTP at the lateral epicondyle. Mild pain with finger/wrist extension against resistance. No pain with gripping or finger/wrist flexion against resistance. No evidence of pain or laxity at the UCL.  Hand, Left: Inspection yielded no erythema, ecchymosis, bony deformity, or swelling. ROM full with good flexion and extension and ulnar/radial deviation that is symmetrical with opposite 2nd MCP. Palpation is normal over metacarpals, scaphoid, lunate, and TFCC; Ulnar sided  collateral ligament of the 2nd metacarpal is TTP; other tendons without tenderness/swelling. Strength 5/5 in all directions without pain.   MSK Limited US of 2nd left MCP Joint 2nd MCP joint well visualized with no obvious stress fracture of the metacarpal bones. He does have slight spurring a the head of the MCP joint and on oblique view he does show partial disruption of ulnar sided collateral ligament with minimal surrounding hyperechoic nodule with changes suggesting partial tear of that tendon.  Impression: partial tear of ulnar collateral ligament of index finger MCP joint  Ultrasound and interpretation by Dr. Garlan Fillers and Wolfgang Phoenix. Fields, MD   ASSESSMENT/PLAN:   Collateral ligament lateral disruption, old, left Given symptoms, history, exam, and findings on ultrasound he has a partial tear of the ulnar sided collateral ligament of the 2nd MCP joint on the left hand. - Rest, ice, heat, OTC Tylenol as needed. - Voltaren gel up to 4 times per day - Buddy tape to his 3rd finger for 6 weeks especially with any activities; follow back up with [place] at that time for repeat US scan.     Nuala Alpha, DO [place]   I observed and examined the patient with the fellow and agree with assessment and plan.  Note reviewed and modified by me. Ila Mcgill, MD

## 2020-01-08 NOTE — Assessment & Plan Note (Signed)
Given symptoms, history, exam, and findings on ultrasound he has a partial tear of the ulnar sided collateral ligament of the 2nd MCP joint on the left hand. - Rest, ice, heat, OTC Tylenol as needed. - Voltaren gel up to 4 times per day - Buddy tape to his 3rd finger for 6 weeks especially with any activities; follow back up with [place] at that time for repeat US scan.

## 2020-01-14 DIAGNOSIS — Z125 Encounter for screening for malignant neoplasm of prostate: Secondary | ICD-10-CM | POA: Diagnosis not present

## 2020-01-14 DIAGNOSIS — E7849 Other hyperlipidemia: Secondary | ICD-10-CM | POA: Diagnosis not present

## 2020-01-14 DIAGNOSIS — Z0000 Encounter for general adult medical examination without abnormal findings: Secondary | ICD-10-CM | POA: Diagnosis not present

## 2020-01-21 DIAGNOSIS — Z1212 Encounter for screening for malignant neoplasm of rectum: Secondary | ICD-10-CM | POA: Diagnosis not present

## 2020-01-21 DIAGNOSIS — Z0000 Encounter for general adult medical examination without abnormal findings: Secondary | ICD-10-CM | POA: Diagnosis not present

## 2020-02-19 ENCOUNTER — Other Ambulatory Visit: Payer: Self-pay

## 2020-02-19 ENCOUNTER — Ambulatory Visit: Payer: BC Managed Care – PPO | Admitting: Sports Medicine

## 2020-02-19 DIAGNOSIS — M25612 Stiffness of left shoulder, not elsewhere classified: Secondary | ICD-10-CM

## 2020-02-19 NOTE — Progress Notes (Signed)
    SUBJECTIVE:   CHIEF COMPLAINT / HPI:   F/u for Right Lateral Epicondylitis Patient states he has done the exercises, changed his tennis technique, given it rest, ice, and he has no more issue with his elbow. He feels no pain while playing tennis or during daily activities.  F/u for Left 1st MCP joint pain For his finger he was buddy tapping it however it continued to get irritated and would be sore with certain activities. However, in the past two weeks he has chagned the way he was buddy tapping his finger and now states since he changed it is much improved. Still not completely better but much improved.  Left Shoulder decreased ROM He presents with a new concern today of a decrease in his ability to fully flex his left shoulder. He was playing tennis with a friend who noticed it on his serves. He states it is not painful, he has not had any feeling or sensation of his shoulder being weak or giving out. He never felt a "pop or snap". He endorses no swelling or bruising of the shoulder. When he raises his arm above shoulder height it does get "tight and stiff".   PERTINENT  PMH / PSH: Left shoulder rotator cuff repair in 2018  OBJECTIVE:   BP 116/70   Ht 6' (1.829 m)   Wt 198 lb (89.8 kg)   BMI 26.85 kg/m   Shoulder, Left: No evidence of bony deformity, asymmetry, or muscle atrophy; No tenderness over long head of biceps (bicipital groove). No TTP at [facility] joint. Limited active range of motion of flexion to only 170, otherwise normal. Strength 5/5 throughout. Sensation intact. Peripheral pulses intact.  Special Tests:   - Crossarm test: NEG - Jobe test: NEG   - Hawkins: NEG   - Neer test: NEG   - Gerber lift-off test: NEG   - Belly press test: NEG  ASSESSMENT/PLAN:   Shoulder stiffness, left Given history, clinical presentation, and exam I am not concerned about a reinjury of his rotator cuff. He most likely has increased stiffness and limited ROM in flexion secondary to post  surgical changes to the shoulder capsule. - Cont to do band exercises for external and internal rotation and added flexion and abduction band exercises today - Added weight exercises for passive stretching and serving motion with 5lb weight to do at home. - Return in 4 weeks and if no improvement we will ultrasound his shoulder.   Lateral Epicondylitis, Left Resolved. Continue exercises to prevent reoccurrence.   1st MCP ulnar colateral ligament partial tear, Left Improving. Use Voltaren gel 2-3 times per day. Continue to buddy tape and follow up in 4 weeks.    Nuala Alpha, DO PGY-4, Sports Medicine Fellow [place]  I observed and examined the patient with the [facility] resident and agree with assessment and plan.  Note reviewed and modified by me. Ila Mcgill, MD

## 2020-02-19 NOTE — Patient Instructions (Signed)
It was great to see you today!  Here is what we discussed: 1. Start some stretching and exercises for your shoulder. See the handout you were given. 2. Work on your serving motion 3 sets of 15 reps. 3. Make sure you bend your knees when you serve. No open stance forehand. 4. Keep buddy taping your fingers for the next 4 weeks. 5. We will see you for follow up in 4 weeks and ultrasound your finger and left shoulder at that time.  Call with any questions!

## 2020-02-19 NOTE — Assessment & Plan Note (Signed)
Given history, clinical presentation, and exam I am not concerned about a reinjury of his rotator cuff. He most likely has increased stiffness and limited ROM in flexion secondary to post surgical changes to the shoulder capsule. - Cont to do band exercises for external and internal rotation and added flexion and abduction band exercises today - Added weight exercises for passive stretching and serving motion with 5lb weight to do at home. - Return in 4 weeks and if no improvement we will ultrasound his shoulder.

## 2020-03-18 ENCOUNTER — Ambulatory Visit: Payer: BC Managed Care – PPO | Admitting: Sports Medicine

## 2020-03-18 ENCOUNTER — Other Ambulatory Visit: Payer: Self-pay

## 2020-03-18 VITALS — BP 110/78 | Ht 72.0 in | Wt 200.0 lb

## 2020-03-18 DIAGNOSIS — M2352 Chronic instability of knee, left knee: Secondary | ICD-10-CM | POA: Diagnosis not present

## 2020-03-18 DIAGNOSIS — M25512 Pain in left shoulder: Secondary | ICD-10-CM

## 2020-03-18 NOTE — Progress Notes (Signed)
    SUBJECTIVE:   CHIEF COMPLAINT / HPI:   Left Shoulder  Patient still has decreased range of motion in flexion, extension, and mild pain when reaching the end of his range of motion. Of note, this reduced range of motion is where it has been since his surgery. He is doing the home exercises and range of motion exercises but has not seen any significant increase in range of motion.  Left 2nd MCP joint follow up Improved. Still buddy taping but overall much better.  PERTINENT  PMH / PSH: Hx of left rotator cuff repair  OBJECTIVE:   BP 110/78   Ht 6' (1.829 m)   Wt 200 lb (90.7 kg)   BMI 27.12 kg/m   [facility] 02/19/2020 03/18/2020  Frequency of aerobic exercise (# of days/week) 2 2  Average time in minutes 60 60  Frequency of strengthening activities (# of days/week) 2 2   Shoulder, Left: No evidence of bony deformity, asymmetry, or muscle atrophy; Mild tenderness over long head of biceps (bicipital groove). No TTP at [facility] joint. Limited active and passive range of motion in flexion and extension along with external rotation when compared to the right. Thumb to T12 without significant tenderness. Strength 5/5 throughout.  Sensation intact. Peripheral pulses intact. Special Tests:   - Hawkins: NEG   - Neer test: NEG   - Obrien's test: NEG   - Speeds test: NEG   - Yergason's test: NEG  IMAGING Limited MSK U/S of left 2nd digit: Collateral ligament on the insided of the 2nd digit at the PIP joint well visualized with no hypoechoic fluid collection, no tearing, no avulsion, no calcifications present. Interpretation: Normal  Complete Ultrasound of Left Shoulder Long head of biceps viewed in short and long axis with bullseye sign present in short axis and partial spilt tear viewed in long axis with hypoechoic fluid collection present. Pec major tendon: well visualized and inserting at the greater head of the humerus Subscapularis: well visualized with no  tears, no fiber disruption, no hypoechoic changes.  AC joint: well visualized, no giser sign present Infraspinatus: well visualized with no disruption of the fibers, no hypoechoic changes Supraspinatus: well visualized with no tears or fiber disruption no hypoechoic changes Posterior glenohumeral joint: well visualized Interpretation: Overall, largely normal complete U/S of the shoulder with partial tear of the long head of the biceps tendon  Ultrasound and interpretation by Dr. Garlan Fillers and Wolfgang Phoenix. Fields, MD    ASSESSMENT/PLAN:   Left shoulder pain Continue to work on range of motion exercises given before. Biceps tendon partial tear but he is not having any symptoms. - Added Codman exercises - F/u as needed  Collateral ligament lateral disruption, old, left Healed with no more pain with activity and ultrasound showing no more avulsion fracture - Cont to buddy tape as needed for comfort when active, otherwise, return to normal activity     Nuala Alpha, DO PGY-4, Sports Medicine Fellow [place]  I observed and examined the patient with the [facility] resident and agree with assessment and plan.  Note reviewed and modified by me. Ila Mcgill, MD

## 2020-03-18 NOTE — Assessment & Plan Note (Signed)
Healed with no more pain with activity and ultrasound showing no more avulsion fracture - Cont to buddy tape as needed for comfort when active, otherwise, return to normal activity

## 2020-03-18 NOTE — Patient Instructions (Signed)
-  Work on the motion exercises you were given today -Continue doing tennis specific drills as you have been -You may want to continue buddy taping your finger for tennis for the next 6 months -Follow up with [place] as needed

## 2020-03-18 NOTE — Assessment & Plan Note (Addendum)
Continue to work on range of motion exercises given before. Biceps tendon partial tear but he is not having any symptoms. - Added Codman exercises - F/u as needed

## 2020-05-07 DIAGNOSIS — H25013 Cortical age-related cataract, bilateral: Secondary | ICD-10-CM | POA: Diagnosis not present

## 2020-05-07 DIAGNOSIS — H18513 Endothelial corneal dystrophy, bilateral: Secondary | ICD-10-CM | POA: Diagnosis not present

## 2020-05-07 DIAGNOSIS — H2513 Age-related nuclear cataract, bilateral: Secondary | ICD-10-CM | POA: Diagnosis not present

## 2020-05-07 DIAGNOSIS — H5213 Myopia, bilateral: Secondary | ICD-10-CM | POA: Diagnosis not present

## 2020-05-28 DIAGNOSIS — N401 Enlarged prostate with lower urinary tract symptoms: Secondary | ICD-10-CM | POA: Diagnosis not present

## 2020-05-28 DIAGNOSIS — R351 Nocturia: Secondary | ICD-10-CM | POA: Diagnosis not present

## 2020-06-04 DIAGNOSIS — N5201 Erectile dysfunction due to arterial insufficiency: Secondary | ICD-10-CM | POA: Diagnosis not present

## 2020-06-04 DIAGNOSIS — N401 Enlarged prostate with lower urinary tract symptoms: Secondary | ICD-10-CM | POA: Diagnosis not present

## 2020-06-04 DIAGNOSIS — R972 Elevated prostate specific antigen [PSA]: Secondary | ICD-10-CM | POA: Diagnosis not present

## 2020-06-04 DIAGNOSIS — R351 Nocturia: Secondary | ICD-10-CM | POA: Diagnosis not present

## 2020-07-15 ENCOUNTER — Ambulatory Visit
Admission: RE | Admit: 2020-07-15 | Discharge: 2020-07-15 | Disposition: A | Discharge status: Home / Self Care | Payer: BC Managed Care – PPO | Source: Ambulatory Visit | Attending: Sports Medicine | Admitting: Sports Medicine

## 2020-07-15 ENCOUNTER — Other Ambulatory Visit: Payer: Self-pay

## 2020-07-15 ENCOUNTER — Ambulatory Visit: Payer: BC Managed Care – PPO | Admitting: Sports Medicine

## 2020-07-15 VITALS — BP 121/75 | Ht 72.0 in | Wt 199.0 lb

## 2020-07-15 DIAGNOSIS — M75102 Unspecified rotator cuff tear or rupture of left shoulder, not specified as traumatic: Secondary | ICD-10-CM | POA: Diagnosis not present

## 2020-07-15 DIAGNOSIS — M25512 Pain in left shoulder: Secondary | ICD-10-CM

## 2020-07-15 DIAGNOSIS — M12812 Other specific arthropathies, not elsewhere classified, left shoulder: Secondary | ICD-10-CM | POA: Diagnosis not present

## 2020-07-15 DIAGNOSIS — M19012 Primary osteoarthritis, left shoulder: Secondary | ICD-10-CM | POA: Diagnosis not present

## 2020-07-15 NOTE — Progress Notes (Signed)
Left shoulder clicking  Patient with Hx of RC tear surgery on left shoulder 3.5 years ago He has returned to a lot of tennis  Shoulder has generally done well  02/19/20 seen at [facility] because shoulder getting stiffer By 9/28 on some motion exercises but returned as still did not feel right with clicking [place] that day was unremarkable x for new long head biceps partial tear  Since that time his symptoms are similar No real weakness Tight and stiff at times and clicks Changes his tennis strokes but still gets some anterior and posterior pain at times particularly with serve  ROS No neck pain No radiculopathy  PE Strong W M in NAD BP 121/75   Ht 6' (1.829 m)   Wt 199 lb (90.3 kg)   BMI 26.99 kg/m   Shoulder: Left Inspection reveals no abnormalities, atrophy or asymmetry. Palpation is normal with no tenderness over AC joint or bicipital groove. ROM is full in all planes. Rotator cuff strength normal throughout. No signs of impingement with negative Neer and Hawkin's tests, empty can. Speeds and Yergason's tests normal. No labral pathology noted with negative Obrien's, negative clunk and good stability. Normal scapular function observed. No painful arc and no drop arm sign. No apprehension sign  The only + finding was some crepitation on movement and noted clicking  Repeat Ultrasound of left shoulder Partial biceps tendon tear measures 3 cms long at proximal shoulder on SAX and LAX Supraspinatus looks intact Subscapularis intact Infraspinatus and Teres Minor look intact In muscle belly of infraspinatus there is a hyperechoic area of possible scar tissue No joint effusion Post labrum looks intact  Impression: Partial tear of left biceps tendon with repaired rotator cuff  Ultrasound and interpretation by Wolfgang Phoenix. Maysie Parkhill, MD  XR of Left shoulder Reviewed by me with some likely mild arthritic change Joint space preserved Formal report pending

## 2020-07-15 NOTE — Assessment & Plan Note (Signed)
He is S/P 2 RC tears Had a repair after tear in 1062 Now with clicking and some stiffness I think there is some mild arthritic change on my review of XR  Has 3 cms long partial biceps tear  HEP to work on biceps but complete rupture is probable at some point Keep up ROM  Wait radiology review of shoulder XR but suspect we will want to continue with conservative care Work on ROM Tennis as long as pain is not too high

## 2020-07-24 ENCOUNTER — Ambulatory Visit: Payer: BC Managed Care – PPO | Admitting: Sports Medicine

## 2020-08-19 DIAGNOSIS — D1801 Hemangioma of skin and subcutaneous tissue: Secondary | ICD-10-CM | POA: Diagnosis not present

## 2020-08-19 DIAGNOSIS — L218 Other seborrheic dermatitis: Secondary | ICD-10-CM | POA: Diagnosis not present

## 2020-08-19 DIAGNOSIS — D225 Melanocytic nevi of trunk: Secondary | ICD-10-CM | POA: Diagnosis not present

## 2020-08-19 DIAGNOSIS — L820 Inflamed seborrheic keratosis: Secondary | ICD-10-CM | POA: Diagnosis not present

## 2020-08-19 DIAGNOSIS — D1721 Benign lipomatous neoplasm of skin and subcutaneous tissue of right arm: Secondary | ICD-10-CM | POA: Diagnosis not present

## 2020-08-22 DIAGNOSIS — M25512 Pain in left shoulder: Secondary | ICD-10-CM | POA: Diagnosis not present

## 2020-12-03 DIAGNOSIS — R3912 Poor urinary stream: Secondary | ICD-10-CM | POA: Diagnosis not present

## 2020-12-03 DIAGNOSIS — N401 Enlarged prostate with lower urinary tract symptoms: Secondary | ICD-10-CM | POA: Diagnosis not present

## 2020-12-10 DIAGNOSIS — R972 Elevated prostate specific antigen [PSA]: Secondary | ICD-10-CM | POA: Diagnosis not present

## 2020-12-10 DIAGNOSIS — N5201 Erectile dysfunction due to arterial insufficiency: Secondary | ICD-10-CM | POA: Diagnosis not present

## 2020-12-10 DIAGNOSIS — R3912 Poor urinary stream: Secondary | ICD-10-CM | POA: Diagnosis not present

## 2020-12-10 DIAGNOSIS — N401 Enlarged prostate with lower urinary tract symptoms: Secondary | ICD-10-CM | POA: Diagnosis not present

## 2021-02-26 DIAGNOSIS — E785 Hyperlipidemia, unspecified: Secondary | ICD-10-CM | POA: Diagnosis not present

## 2021-02-26 DIAGNOSIS — Z125 Encounter for screening for malignant neoplasm of prostate: Secondary | ICD-10-CM | POA: Diagnosis not present

## 2021-03-02 DIAGNOSIS — M179 Osteoarthritis of knee, unspecified: Secondary | ICD-10-CM | POA: Diagnosis not present

## 2021-03-02 DIAGNOSIS — Z1339 Encounter for screening examination for other mental health and behavioral disorders: Secondary | ICD-10-CM | POA: Diagnosis not present

## 2021-03-02 DIAGNOSIS — Z0000 Encounter for general adult medical examination without abnormal findings: Secondary | ICD-10-CM | POA: Diagnosis not present

## 2021-03-02 DIAGNOSIS — R82998 Other abnormal findings in urine: Secondary | ICD-10-CM | POA: Diagnosis not present

## 2021-03-02 DIAGNOSIS — Z1331 Encounter for screening for depression: Secondary | ICD-10-CM | POA: Diagnosis not present

## 2021-03-04 DIAGNOSIS — Z1212 Encounter for screening for malignant neoplasm of rectum: Secondary | ICD-10-CM | POA: Diagnosis not present

## 2021-05-07 ENCOUNTER — Ambulatory Visit: Payer: BC Managed Care – PPO | Admitting: Sports Medicine

## 2021-05-07 ENCOUNTER — Ambulatory Visit: Payer: Self-pay

## 2021-05-07 VITALS — BP 124/84 | Ht 72.0 in | Wt 199.0 lb

## 2021-05-07 DIAGNOSIS — M25511 Pain in right shoulder: Secondary | ICD-10-CM | POA: Diagnosis not present

## 2021-05-07 DIAGNOSIS — S46219A Strain of muscle, fascia and tendon of other parts of biceps, unspecified arm, initial encounter: Secondary | ICD-10-CM | POA: Diagnosis not present

## 2021-05-07 MED ORDER — NITROGLYCERIN 0.2 MG/HR TD PT24: MEDICATED_PATCH | TRANSDERMAL | 1 refills | Status: DC

## 2021-05-07 NOTE — Progress Notes (Signed)
  Jonathan Wagner - 65 y.o. male MRN [id number]  Date of birth: 1956/02/19  SUBJECTIVE:   CC: R shoulder pain   [name] is a 65 year old Caucasian male with past medical history of 2 prior left rotator cuff tears S/P repair 2018 and left partial biceps tear, who presents for right shoulder soreness which has occurred intermittently the last few months.  He has a history of L rotator cuff injury x2 and left biceps tendon partial tear.  He reports his left shoulder has been doing well. He feels he may be subconsciously compensating with his right side given his previous injuries on the left.  He is noticing more pain when he throws a tennis ball up in the air to serve.  1 week ago he was cranking the leaf blower to start it and had increased pain with this motion.  He is a side sleeper and experiences pain intermittently while sleeping.  He points to his lateral shoulder when asked where he is having pain.  He denies weakness, tingling, numbness in his RUE.  He has not taken any p.o. medications or iced the area at home.  His goal for the day is to find out what is wrong with the left shoulder, find out how severe the problem is, determine which exercises will be helpful, and determine which exercises can help prevent the problem from occurring again.  ROS: All systems negative aside from those listed in HPI  PHYSICAL EXAM:  VS: BP:124/84  HR: bpm  TEMP: ( )  RESP:   HT:6' (182.9 cm)   WT:199 lb (90.3 kg)  BMI:26.98 PHYSICAL EXAM: Gen: NAD, alert, cooperative with exam, well-appearing HEENT: clear conjunctiva,  CV:  no edema, capillary refill brisk, normal rate Resp: non-labored Skin: no rashes, normal turgor  Neuro: no gross deficits.  Psych:  alert and oriented MSK: No abnormalities noted with inspection, no atrophy, no asymmetry.  TTP lateral deltoid.  RUE ROM full in all planes.  Negative Neer and empty can test, though Hawkins test positive.  Speeds test positive and Yergasons test  normal.  Negative O'Brien's.  Ultrasound Right Shoulder: Partial biceps tendon tear with halo sign Area at proximal biceps with 3 splits on SAX Supraspinatus, infraspinatus, subscapularis, teres minor intact Bursitis with calcifications noted by hypoechoic pocket  Impression: partial biceps tendon tear and subacromial bursitis  Ultrasound and interpretation by Jonathan Wagner. Fields, MD   ASSESSMENT & PLAN:   Jonathan Wagner is a 65 year old Caucasian male with past medical history of 2 prior left rotator cuff tears S/P repair 2018 and left partial biceps tear, who presents for right shoulder pain.  Patient had positive speeds test and ultrasound showing partial biceps tear.  Patient's pain likely secondary to partial biceps tear as well as bursitis.  Plan: -HEP low weight many reps bicep curls -Trial nitroglycerin patch -Aleve twice daily or as needed prior to exercise -Icing after exercise  Portions of this report may have been transcribed using voice recognition software. Every effort was made to ensure accuracy; however, inadvertent computerized transcription errors may be present.   Wayland Denis, MD 05/07/21,  3:44 PM Pager: [phone] Internal Medicine Resident, PGY-1 [facility]   I observed and examined the patient with the resident and agree with assessment and plan.  Note reviewed and modified by me. Ila Mcgill, MD

## 2021-05-07 NOTE — Assessment & Plan Note (Signed)
We will give him a conservative treatment protocol Avoid heavy lifting or positions of stress to the right biceps May try nitroglycerin and see if he is getting a benefit using a quarter patch Recheck as needed

## 2021-05-07 NOTE — Patient Instructions (Addendum)
It was great to see you today!  -You have a partial biceps tendon tear as seen on ultrasound. -Please start the exercises we gave you -You can take 2 Aleve twice a day as needed for pain. Take it with food. -Start the nitroglycerin patches as described below. -Ice for 15 minutes 2-3 times a day, especially after activity.   Nitroglycerin Protocol  Apply 1/4 nitroglycerin patch to affected area daily. Change position of patch within the affected area every 24 hours. You may experience a headache during the first 1-2 weeks of using the patch, these should subside. If you experience headaches after beginning nitroglycerin patch treatment, you may take your preferred over the counter pain reliever. Another side effect of the nitroglycerin patch is skin irritation or rash related to patch adhesive. Please notify our office if you develop more severe headaches or rash, and stop the patch. Tendon healing with nitroglycerin patch may require 12 to 24 weeks depending on the extent of injury. Men should not use if taking Viagra, Cialis, or Levitra.  Do not use if you have migraines or rosacea.

## 2021-05-11 DIAGNOSIS — H2513 Age-related nuclear cataract, bilateral: Secondary | ICD-10-CM | POA: Diagnosis not present

## 2021-05-11 DIAGNOSIS — H25013 Cortical age-related cataract, bilateral: Secondary | ICD-10-CM | POA: Diagnosis not present

## 2021-05-11 DIAGNOSIS — H18513 Endothelial corneal dystrophy, bilateral: Secondary | ICD-10-CM | POA: Diagnosis not present

## 2021-05-11 DIAGNOSIS — H5213 Myopia, bilateral: Secondary | ICD-10-CM | POA: Diagnosis not present

## 2021-05-26 ENCOUNTER — Ambulatory Visit: Payer: BC Managed Care – PPO | Admitting: Sports Medicine

## 2021-07-16 DIAGNOSIS — N401 Enlarged prostate with lower urinary tract symptoms: Secondary | ICD-10-CM | POA: Diagnosis not present

## 2021-07-16 DIAGNOSIS — R3912 Poor urinary stream: Secondary | ICD-10-CM | POA: Diagnosis not present

## 2021-07-22 DIAGNOSIS — N401 Enlarged prostate with lower urinary tract symptoms: Secondary | ICD-10-CM | POA: Diagnosis not present

## 2021-07-22 DIAGNOSIS — R3912 Poor urinary stream: Secondary | ICD-10-CM | POA: Diagnosis not present

## 2021-07-22 DIAGNOSIS — R972 Elevated prostate specific antigen [PSA]: Secondary | ICD-10-CM | POA: Diagnosis not present

## 2021-07-31 DIAGNOSIS — L570 Actinic keratosis: Secondary | ICD-10-CM | POA: Diagnosis not present

## 2021-07-31 DIAGNOSIS — I788 Other diseases of capillaries: Secondary | ICD-10-CM | POA: Diagnosis not present

## 2021-07-31 DIAGNOSIS — L820 Inflamed seborrheic keratosis: Secondary | ICD-10-CM | POA: Diagnosis not present

## 2021-07-31 DIAGNOSIS — D1722 Benign lipomatous neoplasm of skin and subcutaneous tissue of left arm: Secondary | ICD-10-CM | POA: Diagnosis not present

## 2021-07-31 DIAGNOSIS — D485 Neoplasm of uncertain behavior of skin: Secondary | ICD-10-CM | POA: Diagnosis not present

## 2021-07-31 DIAGNOSIS — L819 Disorder of pigmentation, unspecified: Secondary | ICD-10-CM | POA: Diagnosis not present

## 2021-07-31 DIAGNOSIS — D1801 Hemangioma of skin and subcutaneous tissue: Secondary | ICD-10-CM | POA: Diagnosis not present

## 2021-08-04 ENCOUNTER — Ambulatory Visit: Payer: BC Managed Care – PPO | Admitting: Sports Medicine

## 2021-08-04 DIAGNOSIS — S46219A Strain of muscle, fascia and tendon of other parts of biceps, unspecified arm, initial encounter: Secondary | ICD-10-CM | POA: Diagnosis not present

## 2021-08-04 NOTE — Progress Notes (Signed)
CC: F/U RT shoulder pain  Patient has seen steady improvement since we found a partial biceps tendon tear in Nov. 2022  He still feels clicking in the shoulder and sometimes a sharp pain down the back of RT arm to elbow Doing all normal activities with no problem Tosses tennis ball w no pain  ROS  No weakness No radicular sxs into the arm No nekc pain No night pain  PE Fit appearing WM in NAD BP 99/76    Ht 6' (1.829 m)    Wt 199 lb (90.3 kg)    BMI 26.99 kg/m  [facility] Adult Exercise 02/19/2020 03/18/2020 05/07/2021  Frequency of aerobic exercise (# of days/week) 2 2 3   Average time in minutes 60 60 90  Frequency of strengthening activities (# of days/week) 2 2 1    Shoulder:RT Inspection reveals no abnormalities, atrophy or asymmetry. Palpation is normal with no tenderness over AC joint or bicipital groove. ROM is full in all planes. Rotator cuff strength normal throughout. No signs of impingement with negative Neer and Hawkin's tests, empty can. Speeds and Yergason's tests normal. There is mild clicking of the biceps tendon on forearm rotation No labral pathology noted with negative Obrien's, negative clunk and good stability. Normal scapular function observed. However if he let's shoulders roll forward he gets fasculation in periscapular MM This is corrected by scapular assist No painful arc and no drop arm sign. No apprehension sign

## 2021-08-04 NOTE — Assessment & Plan Note (Signed)
This is improving with strength better No real pain May have a retinaculum tear as there seems to be some mild subluxation at cicipital groove with forearm rotation  Post arm pain is positional and likely a neurapraxia  Plan to add scap retraction exercise series  Trial with PT pilates to work on more balanced shoulder strength and flexibility

## 2021-11-13 DIAGNOSIS — R310 Gross hematuria: Secondary | ICD-10-CM | POA: Diagnosis not present

## 2021-11-13 DIAGNOSIS — R21 Rash and other nonspecific skin eruption: Secondary | ICD-10-CM | POA: Diagnosis not present

## 2021-11-13 DIAGNOSIS — Z113 Encounter for screening for infections with a predominantly sexual mode of transmission: Secondary | ICD-10-CM | POA: Diagnosis not present

## 2021-11-13 DIAGNOSIS — B009 Herpesviral infection, unspecified: Secondary | ICD-10-CM | POA: Diagnosis not present

## 2021-11-17 ENCOUNTER — Ambulatory Visit: Payer: Self-pay

## 2021-11-17 ENCOUNTER — Ambulatory Visit: Payer: BC Managed Care – PPO | Admitting: Sports Medicine

## 2021-11-17 VITALS — BP 112/60 | Ht 72.0 in | Wt 199.0 lb

## 2021-11-17 DIAGNOSIS — M7660 Achilles tendinitis, unspecified leg: Secondary | ICD-10-CM

## 2021-11-17 DIAGNOSIS — S46219A Strain of muscle, fascia and tendon of other parts of biceps, unspecified arm, initial encounter: Secondary | ICD-10-CM

## 2021-11-17 DIAGNOSIS — M9262 Juvenile osteochondrosis of tarsus, left ankle: Secondary | ICD-10-CM | POA: Diagnosis not present

## 2021-11-17 DIAGNOSIS — M9261 Juvenile osteochondrosis of tarsus, right ankle: Secondary | ICD-10-CM

## 2021-11-17 NOTE — Assessment & Plan Note (Signed)
He still having some shoulder pain but I suspect this is more from the calcific bursitis than from the bicipital tendon itself Pain on testing is mostly with abduction He wants to try CBD oil and I suggested that was fine If still painful we can consider corticosteroid injection or shockwave

## 2021-11-17 NOTE — Assessment & Plan Note (Signed)
I think he aggravated his right Achilles during a tennis match This is improving so I will start him on icing and using heel lifts in his shoes He uses an ankle compression on the right because he said posterior tibial tendinitis in the past  We will recheck this in a couple months Note he has had headaches with nitroglycerin

## 2021-11-17 NOTE — Patient Instructions (Signed)
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

## 2021-11-17 NOTE — Progress Notes (Signed)
Chief complaint right heel pain and some continued pain in his right shoulder  Patient's right shoulder has mild pain that he rates from a 3 to a 4 On his scan he had a calcific bursitis and a partial tear of his biceps tendon when he was seen prior By February this was feeling a lot better He has been more active and it flared again over the last month  Right heel pain came on with vigorous tennis In review of his history we have been seeing bilateral Haglund's deformities He has a chronically thickened Achilles tendon on both sides On this occasion he noticed swelling after a match and it was tender to touch He is rested and couple days a mass not very painful  He states that in the past exercises for his shoulder and for his Achilles have significantly helped him He has not been doing these recently  Physical exam Muscular white male in no acute distress BP 112/60   Ht 6' (1.829 m)   Wt 199 lb (90.3 kg)   BMI 26.99 kg/m   Right shoulder shows a full range of motion However he does have a painful arc on abduction of his shoulder Rotator cuff strength is good Scapular assist makes abduction less painful  He has a palpable thick Achilles and a Haglund's deformity on the right This is mildly tender to palpation On the left he has a less thick Achilles and a small Haglund's that is nontender to palpation  Ultrasound of right Achilles tendon Noted is a significant Haglund's deformity The tendon is thickened at 0.9 cm He has some significant swelling in his retrocalcaneal bursa There is no significant neovascular activity  Left Achilles tendon is almost as thick at 0.86 He does have a Haglund's with fragmentation and calcification There is no bursal swelling or abnormal Doppler activity

## 2021-12-02 DIAGNOSIS — R310 Gross hematuria: Secondary | ICD-10-CM | POA: Diagnosis not present

## 2021-12-02 DIAGNOSIS — N281 Cyst of kidney, acquired: Secondary | ICD-10-CM | POA: Diagnosis not present

## 2021-12-02 DIAGNOSIS — K5730 Diverticulosis of large intestine without perforation or abscess without bleeding: Secondary | ICD-10-CM | POA: Diagnosis not present

## 2021-12-02 DIAGNOSIS — N400 Enlarged prostate without lower urinary tract symptoms: Secondary | ICD-10-CM | POA: Diagnosis not present

## 2021-12-02 DIAGNOSIS — N200 Calculus of kidney: Secondary | ICD-10-CM | POA: Diagnosis not present

## 2022-01-26 ENCOUNTER — Ambulatory Visit: Payer: BC Managed Care – PPO | Admitting: Sports Medicine

## 2022-01-26 VITALS — BP 113/73 | Ht 72.0 in | Wt 202.0 lb

## 2022-01-26 DIAGNOSIS — M222X1 Patellofemoral disorders, right knee: Secondary | ICD-10-CM

## 2022-01-26 NOTE — Progress Notes (Signed)
   Established Patient Office Visit  Subjective   Patient ID: Jonathan Wagner, male    DOB: January 24, 1956  Age: 66 y.o. MRN: [id number]  Chief complaint: Right knee pain  Jonathan Wagner is a 66 year old male who presents today with complaint of right knee pain.  He states he has had pain like this in the past that resolved with home exercise program.  Unfortunately secondary to his job he is not been able to dedicate time to his physical therapy exercises at the gym over the past month.  He reports his knee pain has been worsening over the past couple of weeks.  He describes it as an ache located in the front of his knee.  His pain is worse with activity such as lunging, bending, sitting for prolonged periods of time.  Patient is very active and participates in tennis about 3 times a week.  He is currently using Voltaren gel and Aleve to control his pain on days he plays tennis.  He denies any recent injury to that knee, locking, popping, instability.    Objective:     BP 113/73   Ht 6' (1.829 m)   Wt 202 lb (91.6 kg)   BMI 27.40 kg/m   Physical Exam Vitals reviewed.  Constitutional:      General: He is not in acute distress.    Appearance: Normal appearance. He is not ill-appearing, toxic-appearing or diaphoretic.  HENT:     Head: Normocephalic and atraumatic.  Pulmonary:     Effort: Pulmonary effort is normal.  Neurological:     Mental Status: He is alert.   Right knee: No obvious deformity or asymmetry.  No ecchymosis or edema.  No tenderness to palpation along medial or lateral joint line.  Tenderness to palpation of the anterior patella and anterior medial patella facet.  Full range of motion flexion and extension.  Stable to varus and valgus stress.  Negative Lachman's.  Strength 5/5 flexion and extension.  Atrophy of the VMO as compared to left leg.  No patellar apprehension.  Crepitus present.  His right knee was briefly viewed under ultrasound: His patella and quadriceps tendon were  identified, no obvious abnormality seen.  Patellofemoral joint visualized, equal joint space visualized, no obvious deformity.  No effusion was seen on the exam.  Medial and lateral meniscus were both visualized with no deformity.   Assessment & Plan:   Problem List Items Addressed This Visit       Musculoskeletal and Integument   PFS (patellofemoral syndrome) - Primary    Patient will do quad, VMO, strengthening exercises. He was given handouts for VMO strengthening exercises to be performed daily.  He can use either OTC or topical NSAID as needed for pain.  Would recommend daily anti-inflammatory for the next 2 weeks.  He may continue using Aleve as he seems to get relief from that.  On days he plays tennis he may take second Aleve at dinnertime.  Discussed with patient to avoid any other NSAIDs while taking Aleve.  He then may continue with Aleve as needed.  Ice for 20 minutes after vigorous activity, tennis.  Follow up in 4-6 weeks.       Return in about 6 weeks (around 03/09/2022), or if symptoms worsen or fail to improve.    Elmore Guise, DO  I observed and examined the patient with the resident and agree with assessment and plan.  Note reviewed and modified by me. Ila Mcgill, MD

## 2022-01-26 NOTE — Assessment & Plan Note (Signed)
Patient will do quad, VMO, strengthening exercises. He was given handouts for VMO strengthening exercises to be performed daily.  He can use either OTC or topical NSAID as needed for pain.  Would recommend daily anti-inflammatory for the next 2 weeks.  He may continue using Aleve as he seems to get relief from that.  On days he plays tennis he may take second Aleve at dinnertime.  Discussed with patient to avoid any other NSAIDs while taking Aleve.  He then may continue with Aleve as needed.  Ice for 20 minutes after vigorous activity, tennis.  Follow up in 4-6 weeks.

## 2022-03-01 DIAGNOSIS — E785 Hyperlipidemia, unspecified: Secondary | ICD-10-CM | POA: Diagnosis not present

## 2022-03-01 DIAGNOSIS — Z125 Encounter for screening for malignant neoplasm of prostate: Secondary | ICD-10-CM | POA: Diagnosis not present

## 2022-03-02 IMAGING — DX DG SHOULDER 2+V*L*
3 series · 3 of 3 positions shown · non-contrast
Comparison: MRI left shoulder 09/19/2016

CLINICAL DATA: Left shoulder pain for 3 months. No recent injury.
History of arthritis.

EXAM:
LEFT SHOULDER - 2+ VIEW

[dg shoulder left (1 of 3)]
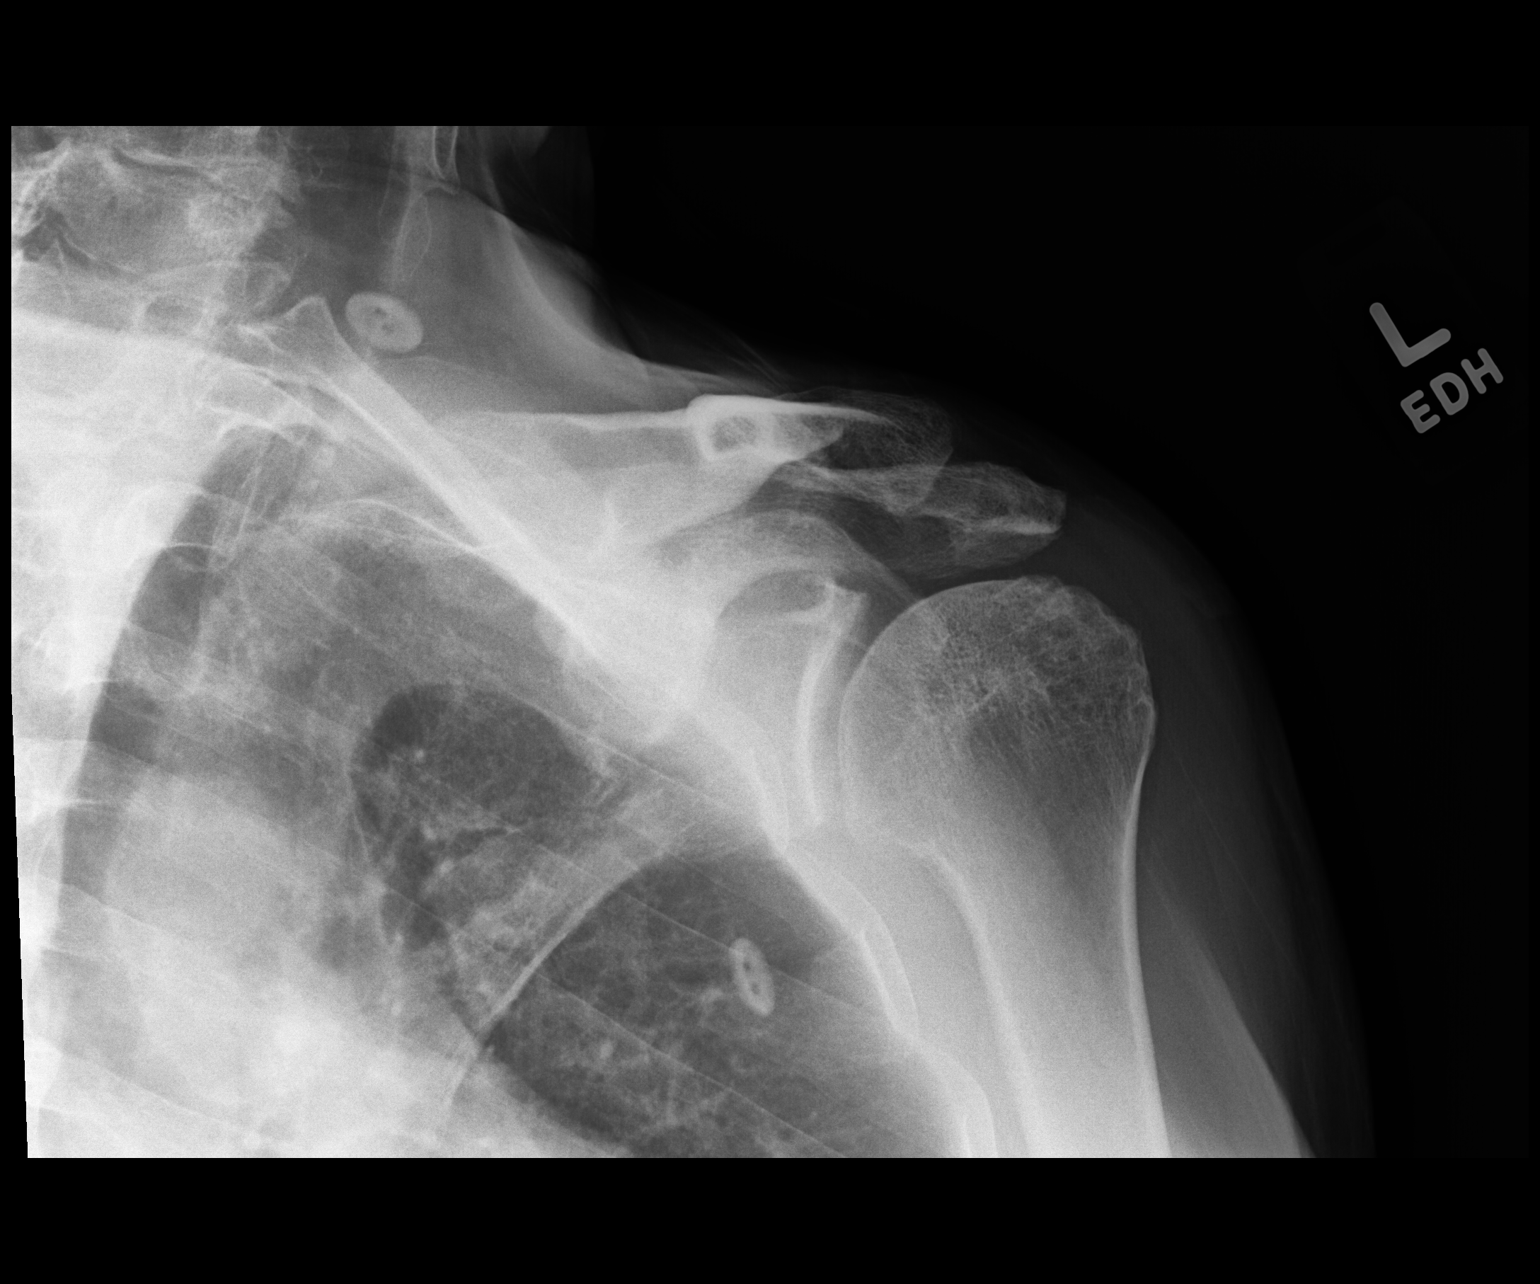

[dg shoulder left (2 of 3)]
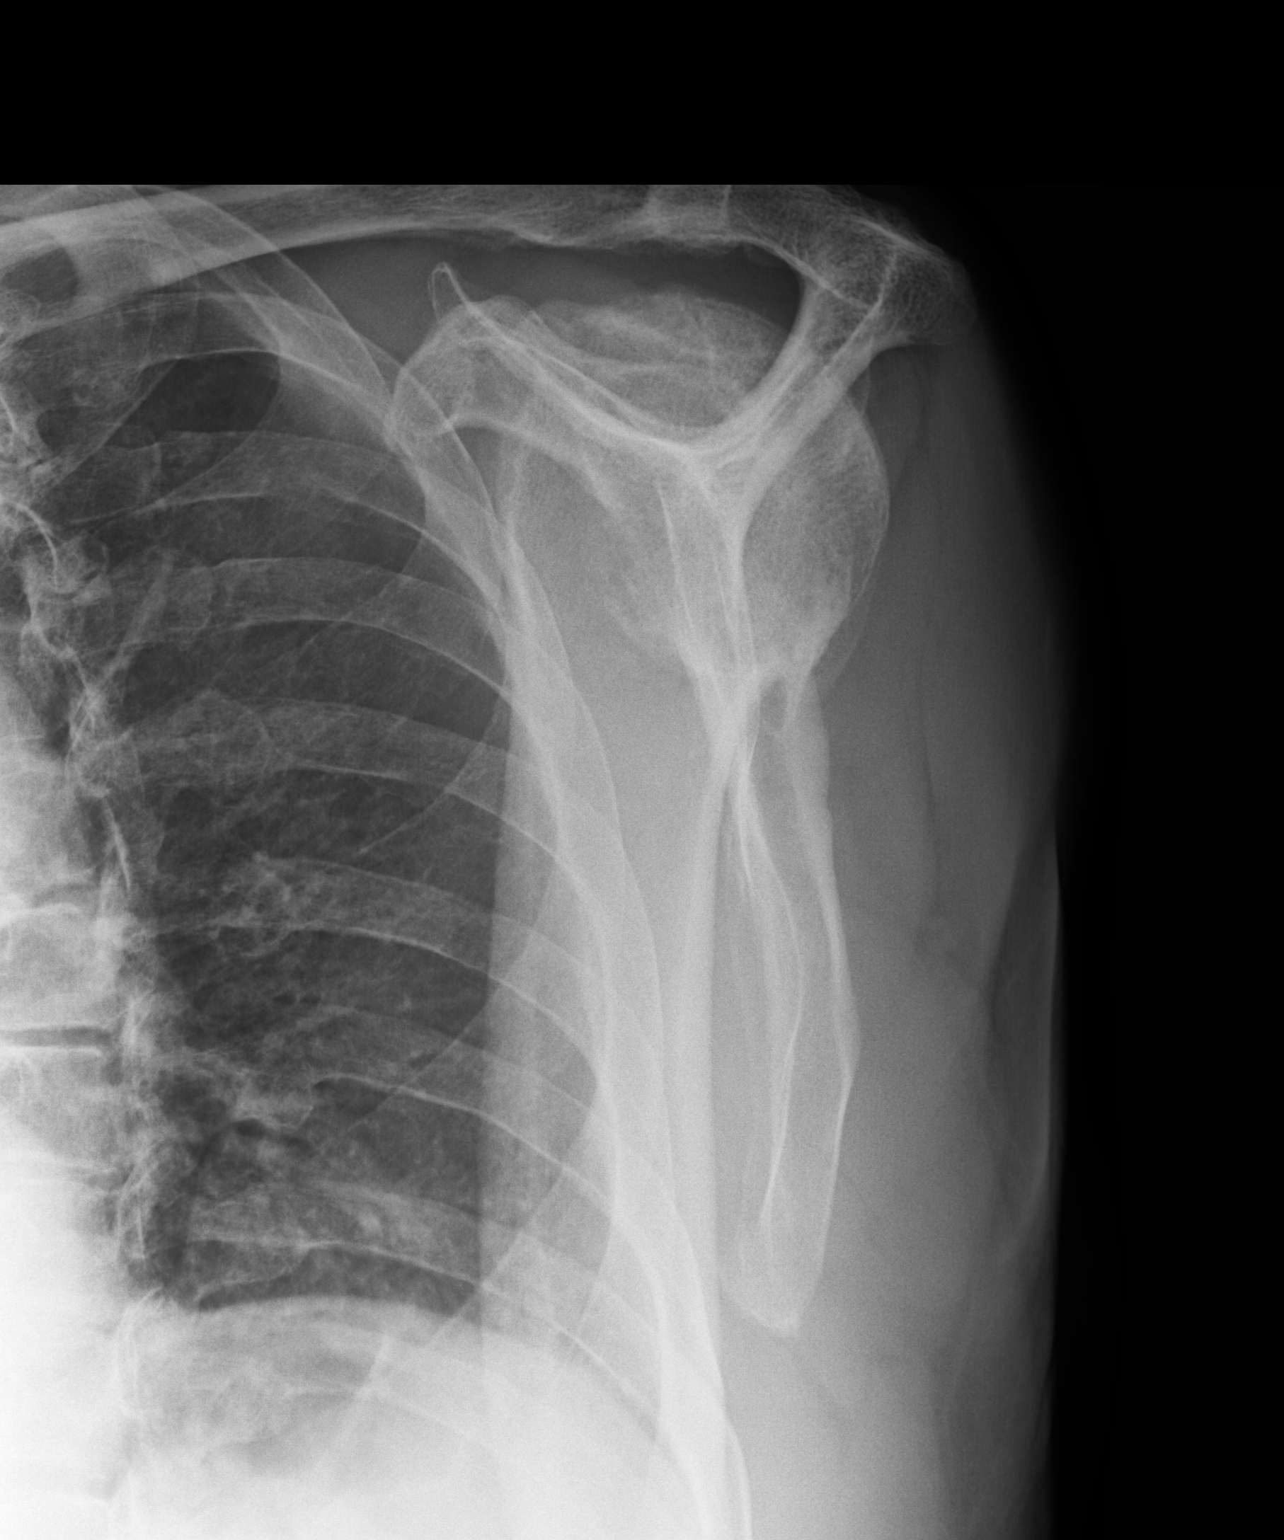

[dg shoulder left (3 of 3)]
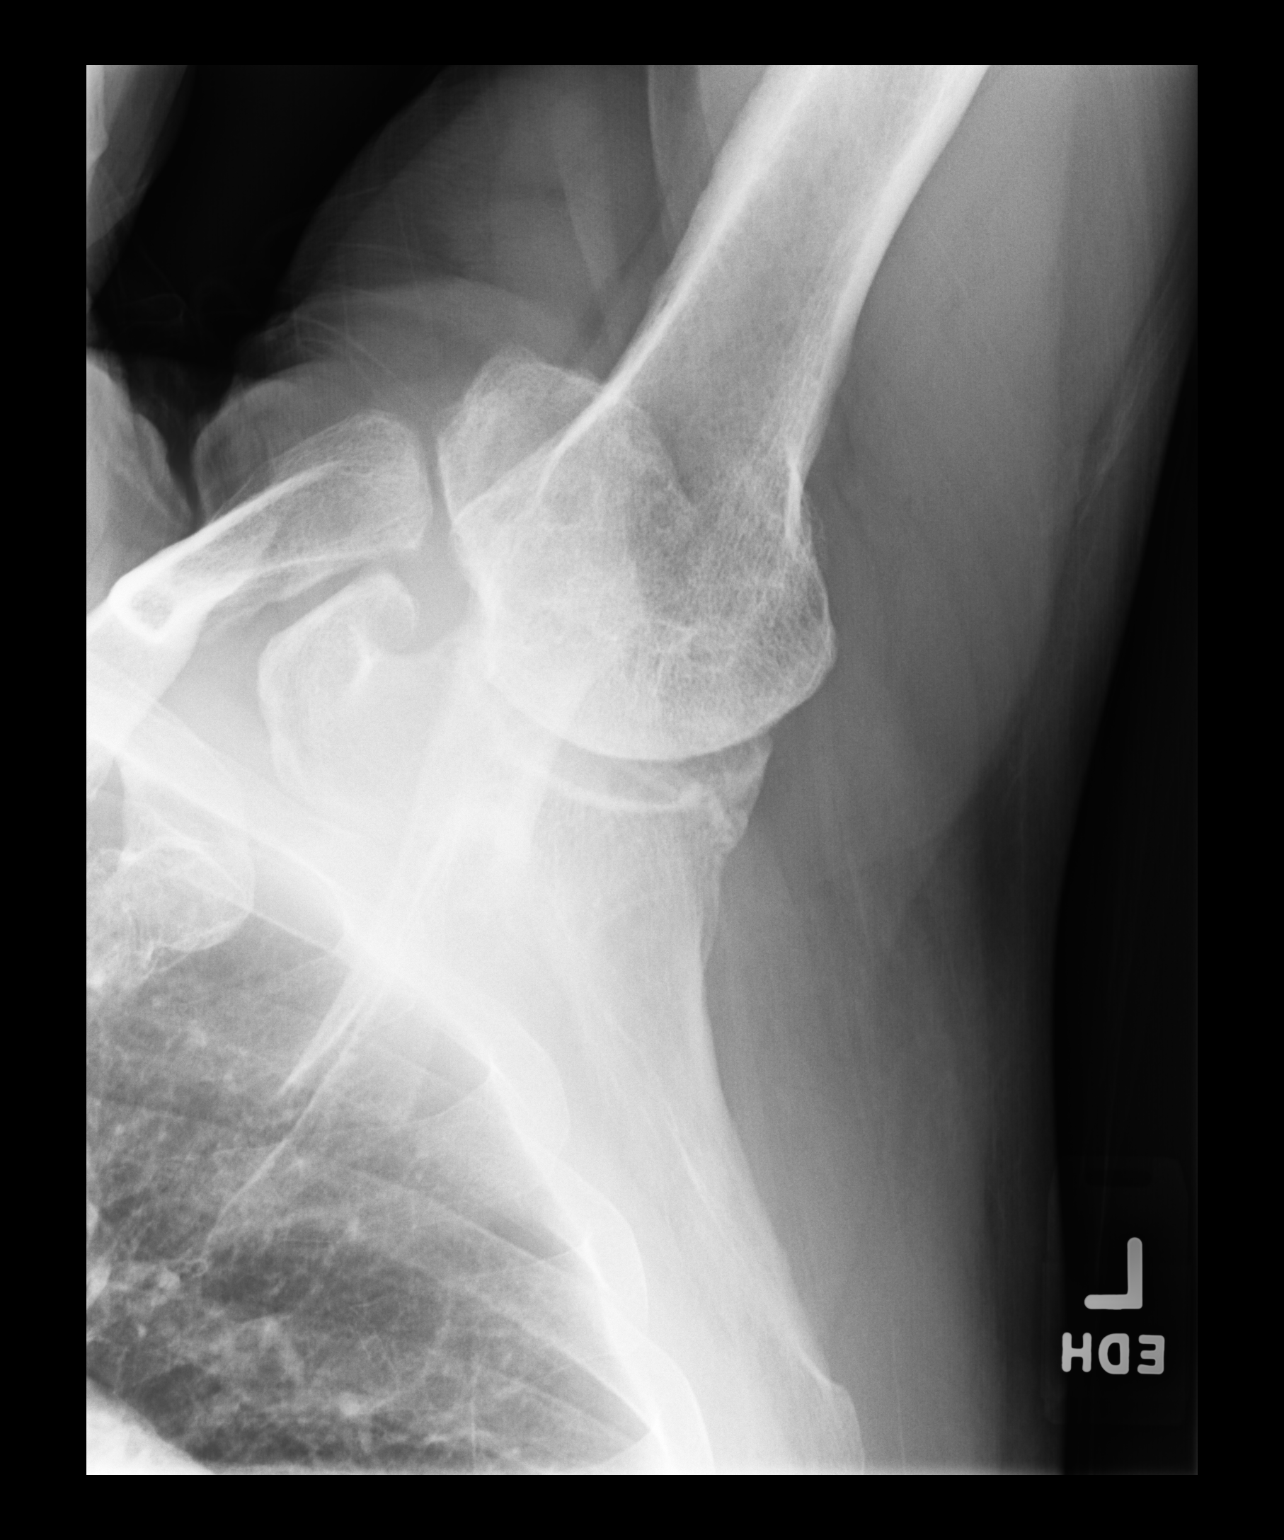

[3 of 3 positions shown; findings below may reference images not displayed]

FINDINGS: Degenerative changes in the glenohumeral and acromioclavicular
joints. Subacromial spur formation. No evidence of acute fracture or
dislocation. No focal bone lesion or bone destruction. Soft tissues
are unremarkable.
IMPRESSION: Degenerative changes in the left shoulder. No acute bony
abnormalities.

## 2022-03-08 DIAGNOSIS — Z1339 Encounter for screening examination for other mental health and behavioral disorders: Secondary | ICD-10-CM | POA: Diagnosis not present

## 2022-03-08 DIAGNOSIS — E663 Overweight: Secondary | ICD-10-CM | POA: Diagnosis not present

## 2022-03-08 DIAGNOSIS — Z1212 Encounter for screening for malignant neoplasm of rectum: Secondary | ICD-10-CM | POA: Diagnosis not present

## 2022-03-08 DIAGNOSIS — R82998 Other abnormal findings in urine: Secondary | ICD-10-CM | POA: Diagnosis not present

## 2022-03-08 DIAGNOSIS — Z1331 Encounter for screening for depression: Secondary | ICD-10-CM | POA: Diagnosis not present

## 2022-03-08 DIAGNOSIS — Z0000 Encounter for general adult medical examination without abnormal findings: Secondary | ICD-10-CM | POA: Diagnosis not present

## 2022-04-07 DIAGNOSIS — M25661 Stiffness of right knee, not elsewhere classified: Secondary | ICD-10-CM | POA: Diagnosis not present

## 2022-04-07 DIAGNOSIS — M2351 Chronic instability of knee, right knee: Secondary | ICD-10-CM | POA: Diagnosis not present

## 2022-04-07 DIAGNOSIS — M1711 Unilateral primary osteoarthritis, right knee: Secondary | ICD-10-CM | POA: Diagnosis not present

## 2022-04-07 DIAGNOSIS — M25561 Pain in right knee: Secondary | ICD-10-CM | POA: Diagnosis not present

## 2022-04-13 DIAGNOSIS — M2351 Chronic instability of knee, right knee: Secondary | ICD-10-CM | POA: Diagnosis not present

## 2022-04-13 DIAGNOSIS — M1711 Unilateral primary osteoarthritis, right knee: Secondary | ICD-10-CM | POA: Diagnosis not present

## 2022-04-13 DIAGNOSIS — M25661 Stiffness of right knee, not elsewhere classified: Secondary | ICD-10-CM | POA: Diagnosis not present

## 2022-04-13 DIAGNOSIS — M25561 Pain in right knee: Secondary | ICD-10-CM | POA: Diagnosis not present

## 2022-04-16 DIAGNOSIS — M25561 Pain in right knee: Secondary | ICD-10-CM | POA: Diagnosis not present

## 2022-04-16 DIAGNOSIS — M25661 Stiffness of right knee, not elsewhere classified: Secondary | ICD-10-CM | POA: Diagnosis not present

## 2022-04-16 DIAGNOSIS — M2351 Chronic instability of knee, right knee: Secondary | ICD-10-CM | POA: Diagnosis not present

## 2022-04-16 DIAGNOSIS — M1711 Unilateral primary osteoarthritis, right knee: Secondary | ICD-10-CM | POA: Diagnosis not present

## 2022-04-23 DIAGNOSIS — M1711 Unilateral primary osteoarthritis, right knee: Secondary | ICD-10-CM | POA: Diagnosis not present

## 2022-04-23 DIAGNOSIS — M25561 Pain in right knee: Secondary | ICD-10-CM | POA: Diagnosis not present

## 2022-04-23 DIAGNOSIS — M2351 Chronic instability of knee, right knee: Secondary | ICD-10-CM | POA: Diagnosis not present

## 2022-04-23 DIAGNOSIS — M25661 Stiffness of right knee, not elsewhere classified: Secondary | ICD-10-CM | POA: Diagnosis not present

## 2022-04-27 DIAGNOSIS — M25661 Stiffness of right knee, not elsewhere classified: Secondary | ICD-10-CM | POA: Diagnosis not present

## 2022-04-27 DIAGNOSIS — M25561 Pain in right knee: Secondary | ICD-10-CM | POA: Diagnosis not present

## 2022-04-27 DIAGNOSIS — M1711 Unilateral primary osteoarthritis, right knee: Secondary | ICD-10-CM | POA: Diagnosis not present

## 2022-04-27 DIAGNOSIS — M2351 Chronic instability of knee, right knee: Secondary | ICD-10-CM | POA: Diagnosis not present

## 2022-05-04 DIAGNOSIS — M25561 Pain in right knee: Secondary | ICD-10-CM | POA: Diagnosis not present

## 2022-05-04 DIAGNOSIS — M1711 Unilateral primary osteoarthritis, right knee: Secondary | ICD-10-CM | POA: Diagnosis not present

## 2022-05-04 DIAGNOSIS — M2351 Chronic instability of knee, right knee: Secondary | ICD-10-CM | POA: Diagnosis not present

## 2022-05-04 DIAGNOSIS — M25661 Stiffness of right knee, not elsewhere classified: Secondary | ICD-10-CM | POA: Diagnosis not present

## 2022-05-06 DIAGNOSIS — L249 Irritant contact dermatitis, unspecified cause: Secondary | ICD-10-CM | POA: Diagnosis not present

## 2022-05-17 DIAGNOSIS — H18513 Endothelial corneal dystrophy, bilateral: Secondary | ICD-10-CM | POA: Diagnosis not present

## 2022-05-17 DIAGNOSIS — H524 Presbyopia: Secondary | ICD-10-CM | POA: Diagnosis not present

## 2022-05-17 DIAGNOSIS — H25011 Cortical age-related cataract, right eye: Secondary | ICD-10-CM | POA: Diagnosis not present

## 2022-05-17 DIAGNOSIS — H5213 Myopia, bilateral: Secondary | ICD-10-CM | POA: Diagnosis not present

## 2022-05-17 DIAGNOSIS — H2513 Age-related nuclear cataract, bilateral: Secondary | ICD-10-CM | POA: Diagnosis not present

## 2022-06-08 DIAGNOSIS — M1711 Unilateral primary osteoarthritis, right knee: Secondary | ICD-10-CM | POA: Diagnosis not present

## 2022-06-08 DIAGNOSIS — M2351 Chronic instability of knee, right knee: Secondary | ICD-10-CM | POA: Diagnosis not present

## 2022-06-08 DIAGNOSIS — M25561 Pain in right knee: Secondary | ICD-10-CM | POA: Diagnosis not present

## 2022-06-08 DIAGNOSIS — M25661 Stiffness of right knee, not elsewhere classified: Secondary | ICD-10-CM | POA: Diagnosis not present

## 2022-06-10 DIAGNOSIS — M25661 Stiffness of right knee, not elsewhere classified: Secondary | ICD-10-CM | POA: Diagnosis not present

## 2022-06-10 DIAGNOSIS — M1711 Unilateral primary osteoarthritis, right knee: Secondary | ICD-10-CM | POA: Diagnosis not present

## 2022-06-10 DIAGNOSIS — M2351 Chronic instability of knee, right knee: Secondary | ICD-10-CM | POA: Diagnosis not present

## 2022-06-10 DIAGNOSIS — M25561 Pain in right knee: Secondary | ICD-10-CM | POA: Diagnosis not present

## 2022-07-14 DIAGNOSIS — N401 Enlarged prostate with lower urinary tract symptoms: Secondary | ICD-10-CM | POA: Diagnosis not present

## 2022-07-14 DIAGNOSIS — R3912 Poor urinary stream: Secondary | ICD-10-CM | POA: Diagnosis not present

## 2022-07-21 DIAGNOSIS — R3912 Poor urinary stream: Secondary | ICD-10-CM | POA: Diagnosis not present

## 2022-07-21 DIAGNOSIS — N5201 Erectile dysfunction due to arterial insufficiency: Secondary | ICD-10-CM | POA: Diagnosis not present

## 2022-07-21 DIAGNOSIS — N401 Enlarged prostate with lower urinary tract symptoms: Secondary | ICD-10-CM | POA: Diagnosis not present

## 2022-07-21 DIAGNOSIS — R972 Elevated prostate specific antigen [PSA]: Secondary | ICD-10-CM | POA: Diagnosis not present

## 2022-09-21 DIAGNOSIS — H18513 Endothelial corneal dystrophy, bilateral: Secondary | ICD-10-CM | POA: Diagnosis not present

## 2022-09-21 DIAGNOSIS — H16203 Unspecified keratoconjunctivitis, bilateral: Secondary | ICD-10-CM | POA: Diagnosis not present

## 2022-11-10 DIAGNOSIS — H04123 Dry eye syndrome of bilateral lacrimal glands: Secondary | ICD-10-CM | POA: Diagnosis not present

## 2022-11-10 DIAGNOSIS — H01004 Unspecified blepharitis left upper eyelid: Secondary | ICD-10-CM | POA: Diagnosis not present

## 2022-11-10 DIAGNOSIS — H01001 Unspecified blepharitis right upper eyelid: Secondary | ICD-10-CM | POA: Diagnosis not present

## 2022-11-18 DIAGNOSIS — L218 Other seborrheic dermatitis: Secondary | ICD-10-CM | POA: Diagnosis not present

## 2022-11-18 DIAGNOSIS — D2372 Other benign neoplasm of skin of left lower limb, including hip: Secondary | ICD-10-CM | POA: Diagnosis not present

## 2022-11-18 DIAGNOSIS — L814 Other melanin hyperpigmentation: Secondary | ICD-10-CM | POA: Diagnosis not present

## 2022-11-18 DIAGNOSIS — L821 Other seborrheic keratosis: Secondary | ICD-10-CM | POA: Diagnosis not present

## 2022-11-18 DIAGNOSIS — L570 Actinic keratosis: Secondary | ICD-10-CM | POA: Diagnosis not present

## 2022-11-18 DIAGNOSIS — D2371 Other benign neoplasm of skin of right lower limb, including hip: Secondary | ICD-10-CM | POA: Diagnosis not present

## 2022-11-19 DIAGNOSIS — M25661 Stiffness of right knee, not elsewhere classified: Secondary | ICD-10-CM | POA: Diagnosis not present

## 2022-11-19 DIAGNOSIS — R262 Difficulty in walking, not elsewhere classified: Secondary | ICD-10-CM | POA: Diagnosis not present

## 2022-11-19 DIAGNOSIS — M2351 Chronic instability of knee, right knee: Secondary | ICD-10-CM | POA: Diagnosis not present

## 2022-11-19 DIAGNOSIS — M1711 Unilateral primary osteoarthritis, right knee: Secondary | ICD-10-CM | POA: Diagnosis not present

## 2022-11-19 DIAGNOSIS — M25561 Pain in right knee: Secondary | ICD-10-CM | POA: Diagnosis not present

## 2022-12-06 DIAGNOSIS — R262 Difficulty in walking, not elsewhere classified: Secondary | ICD-10-CM | POA: Diagnosis not present

## 2022-12-06 DIAGNOSIS — M25661 Stiffness of right knee, not elsewhere classified: Secondary | ICD-10-CM | POA: Diagnosis not present

## 2022-12-06 DIAGNOSIS — M25561 Pain in right knee: Secondary | ICD-10-CM | POA: Diagnosis not present

## 2022-12-06 DIAGNOSIS — M1711 Unilateral primary osteoarthritis, right knee: Secondary | ICD-10-CM | POA: Diagnosis not present

## 2022-12-06 DIAGNOSIS — M2351 Chronic instability of knee, right knee: Secondary | ICD-10-CM | POA: Diagnosis not present

## 2022-12-08 DIAGNOSIS — M25661 Stiffness of right knee, not elsewhere classified: Secondary | ICD-10-CM | POA: Diagnosis not present

## 2022-12-08 DIAGNOSIS — M25561 Pain in right knee: Secondary | ICD-10-CM | POA: Diagnosis not present

## 2022-12-08 DIAGNOSIS — R262 Difficulty in walking, not elsewhere classified: Secondary | ICD-10-CM | POA: Diagnosis not present

## 2022-12-08 DIAGNOSIS — M1711 Unilateral primary osteoarthritis, right knee: Secondary | ICD-10-CM | POA: Diagnosis not present

## 2022-12-08 DIAGNOSIS — M2351 Chronic instability of knee, right knee: Secondary | ICD-10-CM | POA: Diagnosis not present

## 2022-12-14 DIAGNOSIS — M25561 Pain in right knee: Secondary | ICD-10-CM | POA: Diagnosis not present

## 2022-12-14 DIAGNOSIS — M1711 Unilateral primary osteoarthritis, right knee: Secondary | ICD-10-CM | POA: Diagnosis not present

## 2022-12-14 DIAGNOSIS — M2351 Chronic instability of knee, right knee: Secondary | ICD-10-CM | POA: Diagnosis not present

## 2022-12-14 DIAGNOSIS — M25661 Stiffness of right knee, not elsewhere classified: Secondary | ICD-10-CM | POA: Diagnosis not present

## 2022-12-14 DIAGNOSIS — R262 Difficulty in walking, not elsewhere classified: Secondary | ICD-10-CM | POA: Diagnosis not present

## 2022-12-16 DIAGNOSIS — M25561 Pain in right knee: Secondary | ICD-10-CM | POA: Diagnosis not present

## 2022-12-16 DIAGNOSIS — M1711 Unilateral primary osteoarthritis, right knee: Secondary | ICD-10-CM | POA: Diagnosis not present

## 2022-12-16 DIAGNOSIS — M25661 Stiffness of right knee, not elsewhere classified: Secondary | ICD-10-CM | POA: Diagnosis not present

## 2022-12-16 DIAGNOSIS — R262 Difficulty in walking, not elsewhere classified: Secondary | ICD-10-CM | POA: Diagnosis not present

## 2022-12-16 DIAGNOSIS — M2351 Chronic instability of knee, right knee: Secondary | ICD-10-CM | POA: Diagnosis not present

## 2022-12-30 ENCOUNTER — Encounter: Payer: Self-pay | Admitting: Internal Medicine

## 2022-12-30 DIAGNOSIS — R972 Elevated prostate specific antigen [PSA]: Secondary | ICD-10-CM | POA: Diagnosis not present

## 2023-01-04 ENCOUNTER — Encounter: Payer: Self-pay | Admitting: Internal Medicine

## 2023-01-04 ENCOUNTER — Telehealth: Payer: Self-pay | Admitting: Internal Medicine

## 2023-01-04 NOTE — Telephone Encounter (Signed)
As per my report on his most recent examination 5 years ago, he has a history of multiple adenomatous colon polyps and sessile serrated polyp.  Thus, despite a negative examination at that time, the guidelines recommend a follow-up in 5 years (as I indicated on my report).  Thus, the 5-year follow-up interval is appropriate. Thank him for double checking.

## 2023-01-04 NOTE — Telephone Encounter (Signed)
Pt got notification that he is supposed to have a repeat colon done for 5 year recall. Pt reports he did not have any polyps on his last exam and he thought he remembered Dr. Marina Goodell mentioning that he may not need it until 10 years. He wanted Dr. Marina Goodell to review his record and see if he indeed does need the colon now. Please advise.

## 2023-01-04 NOTE — Telephone Encounter (Signed)
Inbound call from patient requesting a f/u call to discuss if it is necessary for him to continue colon procedure.    Please advise.

## 2023-01-04 NOTE — Telephone Encounter (Signed)
Pt states he will check on some dates as he has some upcoming travel plans. He will call back to schedule procedure.

## 2023-01-06 DIAGNOSIS — M1711 Unilateral primary osteoarthritis, right knee: Secondary | ICD-10-CM | POA: Diagnosis not present

## 2023-01-06 DIAGNOSIS — M25561 Pain in right knee: Secondary | ICD-10-CM | POA: Diagnosis not present

## 2023-01-06 DIAGNOSIS — M2351 Chronic instability of knee, right knee: Secondary | ICD-10-CM | POA: Diagnosis not present

## 2023-01-06 DIAGNOSIS — M25661 Stiffness of right knee, not elsewhere classified: Secondary | ICD-10-CM | POA: Diagnosis not present

## 2023-01-06 DIAGNOSIS — R262 Difficulty in walking, not elsewhere classified: Secondary | ICD-10-CM | POA: Diagnosis not present

## 2023-01-12 DIAGNOSIS — M25561 Pain in right knee: Secondary | ICD-10-CM | POA: Diagnosis not present

## 2023-01-12 DIAGNOSIS — R262 Difficulty in walking, not elsewhere classified: Secondary | ICD-10-CM | POA: Diagnosis not present

## 2023-01-12 DIAGNOSIS — M1711 Unilateral primary osteoarthritis, right knee: Secondary | ICD-10-CM | POA: Diagnosis not present

## 2023-01-12 DIAGNOSIS — M25661 Stiffness of right knee, not elsewhere classified: Secondary | ICD-10-CM | POA: Diagnosis not present

## 2023-01-12 DIAGNOSIS — M2351 Chronic instability of knee, right knee: Secondary | ICD-10-CM | POA: Diagnosis not present

## 2023-01-18 DIAGNOSIS — M25561 Pain in right knee: Secondary | ICD-10-CM | POA: Diagnosis not present

## 2023-01-18 DIAGNOSIS — M25661 Stiffness of right knee, not elsewhere classified: Secondary | ICD-10-CM | POA: Diagnosis not present

## 2023-01-18 DIAGNOSIS — R262 Difficulty in walking, not elsewhere classified: Secondary | ICD-10-CM | POA: Diagnosis not present

## 2023-01-18 DIAGNOSIS — M1711 Unilateral primary osteoarthritis, right knee: Secondary | ICD-10-CM | POA: Diagnosis not present

## 2023-01-18 DIAGNOSIS — M2351 Chronic instability of knee, right knee: Secondary | ICD-10-CM | POA: Diagnosis not present

## 2023-02-01 DIAGNOSIS — M25661 Stiffness of right knee, not elsewhere classified: Secondary | ICD-10-CM | POA: Diagnosis not present

## 2023-02-01 DIAGNOSIS — M1711 Unilateral primary osteoarthritis, right knee: Secondary | ICD-10-CM | POA: Diagnosis not present

## 2023-02-01 DIAGNOSIS — R262 Difficulty in walking, not elsewhere classified: Secondary | ICD-10-CM | POA: Diagnosis not present

## 2023-02-01 DIAGNOSIS — M25561 Pain in right knee: Secondary | ICD-10-CM | POA: Diagnosis not present

## 2023-02-01 DIAGNOSIS — M2351 Chronic instability of knee, right knee: Secondary | ICD-10-CM | POA: Diagnosis not present

## 2023-02-10 DIAGNOSIS — M25561 Pain in right knee: Secondary | ICD-10-CM | POA: Diagnosis not present

## 2023-02-10 DIAGNOSIS — M25661 Stiffness of right knee, not elsewhere classified: Secondary | ICD-10-CM | POA: Diagnosis not present

## 2023-02-10 DIAGNOSIS — M2351 Chronic instability of knee, right knee: Secondary | ICD-10-CM | POA: Diagnosis not present

## 2023-02-10 DIAGNOSIS — M1711 Unilateral primary osteoarthritis, right knee: Secondary | ICD-10-CM | POA: Diagnosis not present

## 2023-02-10 DIAGNOSIS — R262 Difficulty in walking, not elsewhere classified: Secondary | ICD-10-CM | POA: Diagnosis not present

## 2023-02-14 DIAGNOSIS — M1711 Unilateral primary osteoarthritis, right knee: Secondary | ICD-10-CM | POA: Diagnosis not present

## 2023-02-14 DIAGNOSIS — M25561 Pain in right knee: Secondary | ICD-10-CM | POA: Diagnosis not present

## 2023-02-14 DIAGNOSIS — M2351 Chronic instability of knee, right knee: Secondary | ICD-10-CM | POA: Diagnosis not present

## 2023-02-14 DIAGNOSIS — M25661 Stiffness of right knee, not elsewhere classified: Secondary | ICD-10-CM | POA: Diagnosis not present

## 2023-02-14 DIAGNOSIS — R262 Difficulty in walking, not elsewhere classified: Secondary | ICD-10-CM | POA: Diagnosis not present

## 2023-02-16 ENCOUNTER — Ambulatory Visit: Payer: PPO | Admitting: Sports Medicine

## 2023-02-16 ENCOUNTER — Other Ambulatory Visit: Payer: Self-pay

## 2023-02-16 VITALS — BP 116/70 | Ht 72.0 in | Wt 199.0 lb

## 2023-02-16 DIAGNOSIS — M25551 Pain in right hip: Secondary | ICD-10-CM | POA: Diagnosis not present

## 2023-02-16 NOTE — Progress Notes (Signed)
PCP: Geoffry Paradise, MD  Subjective:   HPI: Patient is a 67 y.o. male here for acute low back pain for 3 weeks.  Patient reports he was in his usual state of health, going to the gym and playing tennis 3 times weekly.  3 weeks ago he felt sharp pain in right lower back and upper buttock after crossing his right leg over his left to put on a shoe.  He was able to walk off the pain.  He now has intermittent sharp pain in the same place while he plays tennis (particularly with serving and twisting of hip) or does other physical activity.  Typically the pain will be sharp and improve after he stretches and walks.  He went to his physical therapist 2 weeks ago who diagnosed him with a piriformis syndrome.  He has had 2 sessions of dry needling, the first being the most helpful.  He presents today because he feels pain is not improving.  Past Medical History:  Diagnosis Date   Arthritis    knees - mild   BPH (benign prostatic hyperplasia)     Current Outpatient Medications on File Prior to Visit  Medication Sig Dispense Refill   diclofenac sodium (VOLTAREN) 1 % GEL Apply 2 g topically 4 (four) times daily. (Patient not taking: Reported on 03/02/2018) 100 g 1   finasteride (PROSCAR) 5 MG tablet Take 5 mg by mouth daily.     hydrocortisone (ANUSOL-HC) 25 MG suppository Place 1 suppository (25 mg total) rectally at bedtime. 30 suppository 0   Multiple Vitamins-Minerals (MULTIVITAMIN WITH MINERALS) tablet Take 1 tablet by mouth daily.     nitroGLYCERIN (NITRODUR - DOSED IN MG/24 HR) 0.2 mg/hr patch Use 1/4 patch daily to the affected area. 30 patch 1   Probiotic Product (PROBIOTIC DAILY PO) Take by mouth.     vitamin C (ASCORBIC ACID) 250 MG tablet Take 250 mg by mouth daily.     No current facility-administered medications on file prior to visit.    Past Surgical History:  Procedure Laterality Date   COLONOSCOPY  2006, 2013   Marina Goodell - polyps   FATTY TUMOR     REMOVED AS CHILD    POLYPECTOMY  12/04/2004   SHOULDER SURGERY Left    WISDOM TOOTH EXTRACTION      No Known Allergies  BP 116/70   Ht 6' (1.829 m)   Wt 199 lb (90.3 kg)   BMI 26.99 kg/m      02/19/2020   11:52 AM 03/18/2020   10:58 AM 05/07/2021    2:51 PM  [facility]  Frequency of aerobic exercise (# of days/week) 2 2 3   Average time in minutes 60 60 90  Frequency of strengthening activities (# of days/week) 2 2 1         No data to display              Objective:  Physical Exam:  Gen: NAD, comfortable in exam room  Left hip: No gross deformity, no ecchymosis, no swelling.  No TTP. FROM.  5/5 strength and normal sensation. Pain with resisted hip flexion and hip abduction, location of pain is along the origin of hip abductor muscles near iliac crest. No pain with piriformis stretch No pain on deep palpation of piriformis FADIR/FABER negative Straight leg negative really  Ultrasound limited joint space, low right Bone spur at iliac origin of gluteus medius, no tendon tear.   Bone fragment in tendon of gluteus maximus  near origin, and additionally has anechoic region inside muscle tissue suggestive of inflammation.   Impression: traction spur of tendon insertions to iliac crest; gluteus maximum tissue swelling.  Ultrasound and interpretation by Sibyl Parr. Darrick Penna, MD and Tiffany Kocher, DO   Assessment & Plan:  1. Low back pain: 2/2 iliac crest bone spur and gluteus maximus strain. POCUS findings consistent with chronic changes from prior injury. Recommend PT to focus on hip abductors. Home exercise plan demonstrated. Consider ESWT if not improving with exercise alone. Follow-up as needed.

## 2023-02-16 NOTE — Patient Instructions (Addendum)
There is a spur in Gluteus medius located at the insertion of the Iliac Crest.  There is a bone fragment in the Gluteus maximus near the insertion of the Iliac Crest. These are most likely chronic, but may be related to patient's acute pain. Recommend PT to focus on gluteus muscles. Hip abduction and rotation exercises/stretches.  Shockwave therapy may be helpful if patient fails to improve with exercise alone.

## 2023-02-23 DIAGNOSIS — M1711 Unilateral primary osteoarthritis, right knee: Secondary | ICD-10-CM | POA: Diagnosis not present

## 2023-02-23 DIAGNOSIS — M25661 Stiffness of right knee, not elsewhere classified: Secondary | ICD-10-CM | POA: Diagnosis not present

## 2023-02-23 DIAGNOSIS — R262 Difficulty in walking, not elsewhere classified: Secondary | ICD-10-CM | POA: Diagnosis not present

## 2023-02-23 DIAGNOSIS — M2351 Chronic instability of knee, right knee: Secondary | ICD-10-CM | POA: Diagnosis not present

## 2023-02-23 DIAGNOSIS — M25561 Pain in right knee: Secondary | ICD-10-CM | POA: Diagnosis not present

## 2023-02-25 DIAGNOSIS — M2351 Chronic instability of knee, right knee: Secondary | ICD-10-CM | POA: Diagnosis not present

## 2023-02-25 DIAGNOSIS — M25561 Pain in right knee: Secondary | ICD-10-CM | POA: Diagnosis not present

## 2023-02-25 DIAGNOSIS — M25661 Stiffness of right knee, not elsewhere classified: Secondary | ICD-10-CM | POA: Diagnosis not present

## 2023-02-25 DIAGNOSIS — R262 Difficulty in walking, not elsewhere classified: Secondary | ICD-10-CM | POA: Diagnosis not present

## 2023-02-25 DIAGNOSIS — M1711 Unilateral primary osteoarthritis, right knee: Secondary | ICD-10-CM | POA: Diagnosis not present

## 2023-03-02 DIAGNOSIS — M25661 Stiffness of right knee, not elsewhere classified: Secondary | ICD-10-CM | POA: Diagnosis not present

## 2023-03-02 DIAGNOSIS — R262 Difficulty in walking, not elsewhere classified: Secondary | ICD-10-CM | POA: Diagnosis not present

## 2023-03-02 DIAGNOSIS — M25561 Pain in right knee: Secondary | ICD-10-CM | POA: Diagnosis not present

## 2023-03-02 DIAGNOSIS — M2351 Chronic instability of knee, right knee: Secondary | ICD-10-CM | POA: Diagnosis not present

## 2023-03-02 DIAGNOSIS — M1711 Unilateral primary osteoarthritis, right knee: Secondary | ICD-10-CM | POA: Diagnosis not present

## 2023-03-09 DIAGNOSIS — M25561 Pain in right knee: Secondary | ICD-10-CM | POA: Diagnosis not present

## 2023-03-09 DIAGNOSIS — M1711 Unilateral primary osteoarthritis, right knee: Secondary | ICD-10-CM | POA: Diagnosis not present

## 2023-03-09 DIAGNOSIS — M2351 Chronic instability of knee, right knee: Secondary | ICD-10-CM | POA: Diagnosis not present

## 2023-03-09 DIAGNOSIS — M25661 Stiffness of right knee, not elsewhere classified: Secondary | ICD-10-CM | POA: Diagnosis not present

## 2023-03-09 DIAGNOSIS — R262 Difficulty in walking, not elsewhere classified: Secondary | ICD-10-CM | POA: Diagnosis not present

## 2023-03-11 DIAGNOSIS — M2351 Chronic instability of knee, right knee: Secondary | ICD-10-CM | POA: Diagnosis not present

## 2023-03-11 DIAGNOSIS — R262 Difficulty in walking, not elsewhere classified: Secondary | ICD-10-CM | POA: Diagnosis not present

## 2023-03-11 DIAGNOSIS — M25561 Pain in right knee: Secondary | ICD-10-CM | POA: Diagnosis not present

## 2023-03-11 DIAGNOSIS — M25661 Stiffness of right knee, not elsewhere classified: Secondary | ICD-10-CM | POA: Diagnosis not present

## 2023-03-11 DIAGNOSIS — M1711 Unilateral primary osteoarthritis, right knee: Secondary | ICD-10-CM | POA: Diagnosis not present

## 2023-03-11 DIAGNOSIS — R7989 Other specified abnormal findings of blood chemistry: Secondary | ICD-10-CM | POA: Diagnosis not present

## 2023-03-11 DIAGNOSIS — E785 Hyperlipidemia, unspecified: Secondary | ICD-10-CM | POA: Diagnosis not present

## 2023-03-11 DIAGNOSIS — N401 Enlarged prostate with lower urinary tract symptoms: Secondary | ICD-10-CM | POA: Diagnosis not present

## 2023-03-14 DIAGNOSIS — Z1339 Encounter for screening examination for other mental health and behavioral disorders: Secondary | ICD-10-CM | POA: Diagnosis not present

## 2023-03-14 DIAGNOSIS — M179 Osteoarthritis of knee, unspecified: Secondary | ICD-10-CM | POA: Diagnosis not present

## 2023-03-14 DIAGNOSIS — E663 Overweight: Secondary | ICD-10-CM | POA: Diagnosis not present

## 2023-03-14 DIAGNOSIS — R82998 Other abnormal findings in urine: Secondary | ICD-10-CM | POA: Diagnosis not present

## 2023-03-14 DIAGNOSIS — Z1331 Encounter for screening for depression: Secondary | ICD-10-CM | POA: Diagnosis not present

## 2023-03-14 DIAGNOSIS — E785 Hyperlipidemia, unspecified: Secondary | ICD-10-CM | POA: Diagnosis not present

## 2023-03-14 DIAGNOSIS — N401 Enlarged prostate with lower urinary tract symptoms: Secondary | ICD-10-CM | POA: Diagnosis not present

## 2023-03-14 DIAGNOSIS — Z0000 Encounter for general adult medical examination without abnormal findings: Secondary | ICD-10-CM | POA: Diagnosis not present

## 2023-03-17 DIAGNOSIS — M2351 Chronic instability of knee, right knee: Secondary | ICD-10-CM | POA: Diagnosis not present

## 2023-03-17 DIAGNOSIS — M25661 Stiffness of right knee, not elsewhere classified: Secondary | ICD-10-CM | POA: Diagnosis not present

## 2023-03-17 DIAGNOSIS — R262 Difficulty in walking, not elsewhere classified: Secondary | ICD-10-CM | POA: Diagnosis not present

## 2023-03-17 DIAGNOSIS — M1711 Unilateral primary osteoarthritis, right knee: Secondary | ICD-10-CM | POA: Diagnosis not present

## 2023-03-17 DIAGNOSIS — M25561 Pain in right knee: Secondary | ICD-10-CM | POA: Diagnosis not present

## 2023-03-22 ENCOUNTER — Ambulatory Visit (AMBULATORY_SURGERY_CENTER): Payer: PPO | Admitting: *Deleted

## 2023-03-22 ENCOUNTER — Encounter: Payer: Self-pay | Admitting: Internal Medicine

## 2023-03-22 VITALS — Ht 72.0 in | Wt 199.0 lb

## 2023-03-22 DIAGNOSIS — Z85038 Personal history of other malignant neoplasm of large intestine: Secondary | ICD-10-CM

## 2023-03-22 MED ORDER — NA SULFATE-K SULFATE-MG SULF 17.5-3.13-1.6 GM/177ML PO SOLN: 1.0000 | Freq: Once | ORAL | 0 refills | Status: DC

## 2023-03-22 NOTE — Progress Notes (Signed)
Pt's name and DOB verified at the beginning of the pre-visit.  Pt denies any difficulty with ambulating,sitting, laying down or rolling side to side Gave both [facility] main # and MD on call # prior to instructions.  No egg or soy allergy known to patient  No issues known to pt with past sedation with any surgeries or procedures Pt denies having issues being intubated Pt has no issues moving head neck or swallowing No FH of Malignant Hyperthermia Pt is not on diet pills Pt is not on home 02  Pt is not on blood thinners  Pt denies issues with constipation l Pt is not on dialysis Pt denise any abnormal heart rhythms  Pt denies any upcoming cardiac testing Pt encouraged to use to use Singlecare or Goodrx to reduce cost  Patient's chart reviewed by Cathlyn Parsons CNRA prior to pre-visit and patient appropriate for the [facility].  Pre-visit completed and red dot placed by patient's name on their procedure day (on provider's schedule).  . Visit by phone Pt states weight is 199 lb Instructed pt why it is important to and  to call if they have any changes in health or new medications. Directed them to the # given and on instructions.   Pt states they will.  Instructions reviewed with pt and pt states understanding. Instructed to review again prior to procedure. Pt states they will.  Instructions sent by mail with coupon and by my chart

## 2023-04-14 ENCOUNTER — Encounter: Payer: BC Managed Care – PPO | Admitting: Internal Medicine

## 2023-04-15 DIAGNOSIS — M2351 Chronic instability of knee, right knee: Secondary | ICD-10-CM | POA: Diagnosis not present

## 2023-04-15 DIAGNOSIS — M1711 Unilateral primary osteoarthritis, right knee: Secondary | ICD-10-CM | POA: Diagnosis not present

## 2023-04-15 DIAGNOSIS — R262 Difficulty in walking, not elsewhere classified: Secondary | ICD-10-CM | POA: Diagnosis not present

## 2023-04-15 DIAGNOSIS — M25561 Pain in right knee: Secondary | ICD-10-CM | POA: Diagnosis not present

## 2023-04-15 DIAGNOSIS — M25661 Stiffness of right knee, not elsewhere classified: Secondary | ICD-10-CM | POA: Diagnosis not present

## 2023-05-04 DIAGNOSIS — L218 Other seborrheic dermatitis: Secondary | ICD-10-CM | POA: Diagnosis not present

## 2023-05-23 ENCOUNTER — Encounter: Payer: Self-pay | Admitting: Internal Medicine

## 2023-05-23 DIAGNOSIS — H18513 Endothelial corneal dystrophy, bilateral: Secondary | ICD-10-CM | POA: Diagnosis not present

## 2023-05-23 DIAGNOSIS — H5213 Myopia, bilateral: Secondary | ICD-10-CM | POA: Diagnosis not present

## 2023-05-23 DIAGNOSIS — H2513 Age-related nuclear cataract, bilateral: Secondary | ICD-10-CM | POA: Diagnosis not present

## 2023-05-23 DIAGNOSIS — H25011 Cortical age-related cataract, right eye: Secondary | ICD-10-CM | POA: Diagnosis not present

## 2023-05-30 ENCOUNTER — Other Ambulatory Visit: Payer: Self-pay | Admitting: *Deleted

## 2023-05-30 ENCOUNTER — Telehealth: Payer: Self-pay | Admitting: Internal Medicine

## 2023-05-30 DIAGNOSIS — Z85038 Personal history of other malignant neoplasm of large intestine: Secondary | ICD-10-CM

## 2023-05-30 MED ORDER — NA SULFATE-K SULFATE-MG SULF 17.5-3.13-1.6 GM/177ML PO SOLN: 1.0000 | Freq: Once | ORAL | 0 refills | Status: AC

## 2023-05-30 NOTE — Telephone Encounter (Signed)
PT is calling to have Suprep sent to [facility] on [address]. Requesting a call once sent. Please advise.

## 2023-05-30 NOTE — Telephone Encounter (Signed)
Resent SUPREP to [employer].  Patient notified.

## 2023-06-06 ENCOUNTER — Ambulatory Visit: Payer: PPO | Admitting: Internal Medicine

## 2023-06-06 ENCOUNTER — Encounter: Payer: Self-pay | Admitting: Internal Medicine

## 2023-06-06 VITALS — BP 137/72 | HR 53 | Temp 97.4°F | Resp 12 | Ht 72.0 in | Wt 199.0 lb

## 2023-06-06 DIAGNOSIS — D120 Benign neoplasm of cecum: Secondary | ICD-10-CM | POA: Diagnosis not present

## 2023-06-06 DIAGNOSIS — Z1211 Encounter for screening for malignant neoplasm of colon: Secondary | ICD-10-CM

## 2023-06-06 DIAGNOSIS — K635 Polyp of colon: Secondary | ICD-10-CM

## 2023-06-06 DIAGNOSIS — K648 Other hemorrhoids: Secondary | ICD-10-CM | POA: Diagnosis not present

## 2023-06-06 DIAGNOSIS — Z860101 Personal history of adenomatous and serrated colon polyps: Secondary | ICD-10-CM

## 2023-06-06 DIAGNOSIS — D123 Benign neoplasm of transverse colon: Secondary | ICD-10-CM | POA: Diagnosis not present

## 2023-06-06 DIAGNOSIS — Z860100 Personal history of colon polyps, unspecified: Secondary | ICD-10-CM

## 2023-06-06 MED ORDER — SODIUM CHLORIDE 0.9 % IV SOLN: 500.0000 mL | Freq: Once | INTRAVENOUS | Status: DC

## 2023-06-06 NOTE — Patient Instructions (Signed)
YOU HAD AN ENDOSCOPIC PROCEDURE TODAY: Refer to the procedure report and other information in the discharge instructions given to you for any specific questions about what was found during the examination. If this information does not answer your questions, please call [place] office at [phone] to clarify.   YOU SHOULD EXPECT: Some feelings of bloating in the abdomen. Passage of more gas than usual. Walking can help get rid of the air that was put into your GI tract during the procedure and reduce the bloating. If you had a lower endoscopy (such as a colonoscopy or flexible sigmoidoscopy) you may notice spotting of blood in your stool or on the toilet paper. Some abdominal soreness may be present for a day or two, also.  DIET: Your first meal following the procedure should be a light meal and then it is ok to progress to your normal diet. A half-sandwich or bowl of soup is an example of a good first meal. Heavy or fried foods are harder to digest and may make you feel nauseous or bloated. Drink plenty of fluids but you should avoid alcoholic beverages for 24 hours. If you had a esophageal dilation, please see attached instructions for diet.    ACTIVITY: Your care partner should take you home directly after the procedure. You should plan to take it easy, moving slowly for the rest of the day. You can resume normal activity the day after the procedure however YOU SHOULD NOT DRIVE, use power tools, machinery or perform tasks that involve climbing or major physical exertion for 24 hours (because of the sedation medicines used during the test).   SYMPTOMS TO REPORT IMMEDIATELY: A gastroenterologist can be reached at any hour. Please call [phone]  for any of the following symptoms:  Following lower endoscopy (colonoscopy, ) Excessive amounts of blood in the stool  Significant tenderness, worsening of abdominal pains  Swelling of the abdomen that is new, acute  Fever of 100 or higher   FOLLOW UP:   If any biopsies were taken you will be contacted by phone or by letter within the next 1-3 weeks. Call [phone]  if you have not heard about the biopsies in 3 weeks.  Please also call with any specific questions about appointments or follow up tests.

## 2023-06-06 NOTE — Progress Notes (Signed)
HISTORY OF PRESENT ILLNESS:  Jonathan Wagner is a 67 y.o. male with a history of multiple adenomatous and sessile serrated colon polyps.  Now for surveillance colonoscopy  REVIEW OF SYSTEMS:  All non-GI ROS negative except for  Past Medical History:  Diagnosis Date   Arthritis    knees - mild   BPH (benign prostatic hyperplasia)     Past Surgical History:  Procedure Laterality Date   COLONOSCOPY  2006, 2013   Marina Goodell - polyps   FATTY TUMOR     REMOVED AS CHILD   HERNIA REPAIR Right    2020   POLYPECTOMY  12/04/2004   SHOULDER SURGERY Left    WISDOM TOOTH EXTRACTION      Social History Jonathan Wagner  reports that he has never smoked. He has never used smokeless tobacco. He reports current alcohol use of about 16.0 standard drinks of alcohol per week. He reports that he does not use drugs.  family history includes Breast cancer in his mother; Prostate cancer in his father.  No Known Allergies     PHYSICAL EXAMINATION: Vital signs: BP (!) 133/94   Pulse 63   Temp (!) 97.4 F (36.3 C) (Temporal)   Resp 12   Ht 6' (1.829 m)   Wt 199 lb (90.3 kg)   SpO2 95%   BMI 26.99 kg/m  General: Well-developed, well-nourished, no acute distress HEENT: Sclerae are anicteric, conjunctiva pink. Oral mucosa intact Lungs: Clear Heart: Regular Abdomen: soft, nontender, nondistended, no obvious ascites, no peritoneal signs, normal bowel sounds. No organomegaly. Extremities: No edema Psychiatric: alert and oriented x3. Cooperative     ASSESSMENT:  Personal history of adenomatous and sessile serrated polyps   PLAN:   Surveillance colonoscopy

## 2023-06-06 NOTE — Progress Notes (Signed)
Called to room to assist during endoscopic procedure.  Patient ID and intended procedure confirmed with present staff. Received instructions for my participation in the procedure from the performing physician.  

## 2023-06-06 NOTE — Op Note (Signed)
[facility] Patient Name: Jonathan Wagner Procedure Date: 06/06/2023 8:13 AM MRN: [id number] Endoscopist: Wilhemina Bonito. Marina Goodell , MD, [id number] Age: 67 Referring MD:  Date of Birth: 24-Apr-1956 Gender: Male Account #: [id number] Procedure:                Colonoscopy with cold snare polypectomy x 2 Indications:              High risk colon cancer surveillance: Personal                            history of multiple adenomas, High risk colon                            cancer surveillance: Personal history of sessile                            serrated colon polyp (less than 10 mm in size) with                            no dysplasia. Previous examinations 2006, 2013, 2019 Medicines:                Monitored Anesthesia Care Procedure:                Pre-Anesthesia Assessment:                           - Prior to the procedure, a History and Physical                            was performed, and patient medications and                            allergies were reviewed. The patient's tolerance of                            previous anesthesia was also reviewed. The risks                            and benefits of the procedure and the sedation                            options and risks were discussed with the patient.                            All questions were answered, and informed consent                            was obtained. Prior Anticoagulants: The patient has                            taken no anticoagulant or antiplatelet agents.                            After reviewing the risks and benefits, the patient  was deemed in satisfactory condition to undergo the                            procedure.                           After obtaining informed consent, the colonoscope                            was passed under direct vision. Throughout the                            procedure, the patient's blood pressure, pulse, and                             oxygen saturations were monitored continuously. The                            CF [id number] #[id number] was introduced through the anus                            and advanced to the the cecum, identified by                            appendiceal orifice and ileocecal valve. The                            ileocecal valve, appendiceal orifice, and rectum                            were photographed. The quality of the bowel                            preparation was excellent. The colonoscopy was                            performed without difficulty. The patient tolerated                            the procedure well. The bowel preparation used was                            SUPREP via split dose instruction. Scope In: 8:24:50 AM Scope Out: 8:37:16 AM Scope Withdrawal Time: 0 hours 10 minutes 9 seconds  Total Procedure Duration: 0 hours 12 minutes 26 seconds  Findings:                 Two polyps were found in the transverse colon and                            cecum. The polyps were 1 to 6 mm in size.                           Multiple small-mouthed diverticula were found in  the left colon.                           Internal hemorrhoids were found during                            retroflexion. The hemorrhoids were moderate.                           The exam was otherwise without abnormality on                            direct and retroflexion views. Complications:            No immediate complications. Estimated blood loss:                            None. Estimated Blood Loss:     Estimated blood loss: none. Impression:               - Two 1 to 6 mm polyps in the transverse colon and                            in the cecum.                           - Diverticulosis in the left colon.                           - Internal hemorrhoids.                           - The examination was otherwise normal on direct                            and retroflexion views.                            - No specimens collected. Recommendation:           - Repeat colonoscopy in 5 years for surveillance.                           - Patient has a contact number available for                            emergencies. The signs and symptoms of potential                            delayed complications were discussed with the                            patient. Return to normal activities tomorrow.                            Written discharge instructions were provided to the  patient.                           - Resume previous diet.                           - Continue present medications.                           - Await pathology results. Wilhemina Bonito. Marina Goodell, MD 06/06/2023 8:54:45 AM This report has been signed electronically.

## 2023-06-06 NOTE — Progress Notes (Signed)
Pt's states no medical or surgical changes since previsit or office visit. 

## 2023-06-06 NOTE — Progress Notes (Signed)
Vss nad trans to pacu 

## 2023-06-07 ENCOUNTER — Telehealth: Payer: Self-pay

## 2023-06-07 NOTE — Telephone Encounter (Signed)
Follow up call placed, VM obtained and message left. 

## 2023-06-08 LAB — SURGICAL PATHOLOGY

## 2023-06-16 ENCOUNTER — Encounter: Payer: Self-pay | Admitting: Internal Medicine

## 2023-06-29 DIAGNOSIS — R972 Elevated prostate specific antigen [PSA]: Secondary | ICD-10-CM | POA: Diagnosis not present

## 2023-07-06 DIAGNOSIS — N5201 Erectile dysfunction due to arterial insufficiency: Secondary | ICD-10-CM | POA: Diagnosis not present

## 2023-07-06 DIAGNOSIS — R972 Elevated prostate specific antigen [PSA]: Secondary | ICD-10-CM | POA: Diagnosis not present

## 2023-07-06 DIAGNOSIS — R3912 Poor urinary stream: Secondary | ICD-10-CM | POA: Diagnosis not present

## 2023-07-06 DIAGNOSIS — N401 Enlarged prostate with lower urinary tract symptoms: Secondary | ICD-10-CM | POA: Diagnosis not present

## 2023-08-11 DIAGNOSIS — R3912 Poor urinary stream: Secondary | ICD-10-CM | POA: Diagnosis not present

## 2023-08-11 DIAGNOSIS — N341 Nonspecific urethritis: Secondary | ICD-10-CM | POA: Diagnosis not present

## 2023-08-22 DIAGNOSIS — S39012D Strain of muscle, fascia and tendon of lower back, subsequent encounter: Secondary | ICD-10-CM | POA: Diagnosis not present

## 2023-08-22 DIAGNOSIS — M5459 Other low back pain: Secondary | ICD-10-CM | POA: Diagnosis not present

## 2023-08-25 DIAGNOSIS — R3912 Poor urinary stream: Secondary | ICD-10-CM | POA: Diagnosis not present

## 2023-08-25 DIAGNOSIS — N401 Enlarged prostate with lower urinary tract symptoms: Secondary | ICD-10-CM | POA: Diagnosis not present

## 2023-08-25 DIAGNOSIS — N341 Nonspecific urethritis: Secondary | ICD-10-CM | POA: Diagnosis not present

## 2023-09-01 DIAGNOSIS — R3912 Poor urinary stream: Secondary | ICD-10-CM | POA: Diagnosis not present

## 2023-09-01 DIAGNOSIS — N401 Enlarged prostate with lower urinary tract symptoms: Secondary | ICD-10-CM | POA: Diagnosis not present

## 2023-09-01 DIAGNOSIS — N3943 Post-void dribbling: Secondary | ICD-10-CM | POA: Diagnosis not present

## 2023-09-01 DIAGNOSIS — R300 Dysuria: Secondary | ICD-10-CM | POA: Diagnosis not present

## 2023-09-01 DIAGNOSIS — R102 Pelvic and perineal pain: Secondary | ICD-10-CM | POA: Diagnosis not present

## 2023-09-01 DIAGNOSIS — R350 Frequency of micturition: Secondary | ICD-10-CM | POA: Diagnosis not present

## 2023-09-05 ENCOUNTER — Telehealth: Payer: Self-pay | Admitting: Internal Medicine

## 2023-09-05 DIAGNOSIS — K219 Gastro-esophageal reflux disease without esophagitis: Secondary | ICD-10-CM | POA: Diagnosis not present

## 2023-09-05 DIAGNOSIS — J029 Acute pharyngitis, unspecified: Secondary | ICD-10-CM | POA: Diagnosis not present

## 2023-09-05 DIAGNOSIS — E663 Overweight: Secondary | ICD-10-CM | POA: Diagnosis not present

## 2023-09-05 DIAGNOSIS — E785 Hyperlipidemia, unspecified: Secondary | ICD-10-CM | POA: Diagnosis not present

## 2023-09-05 DIAGNOSIS — F439 Reaction to severe stress, unspecified: Secondary | ICD-10-CM | POA: Diagnosis not present

## 2023-09-05 NOTE — Telephone Encounter (Signed)
 Spoke to patient who states that over the last 4-5 days, he has been experiencing discomfort in his esophagus with swallowing. States he is not having pain but a more "irritated' feeling. At one point he indicates that his esophagus felt like it had "trapped air." Upon further discussion, patient was recently on a prescription of doxycycline twice daily and admits to laying down directly after taking it. He has been off of the medication x 8 days. He denies any associated chest pain or pressure, SOB, diaphoresis, nausea or vomiting. Denies any feelings of heartburn/regurgitation.  Patient has been scheduled to see Alcide Evener, NP in follow up 09/13/23 at 130 pm. Until that time, I recommended patient purchase omeprazole over the counter and take 1 to 2 times daily.  Reflux precautions given as well.  Patient is advised that should he develop chest pain associated with SOB, Dizziness, diaphoresis, nausea/vomiting between now and his appointment, he should seek care at the emergency room. He verbalizes understanding.  Dr Marina Goodell, any additional recommendations/thoughts?

## 2023-09-05 NOTE — Telephone Encounter (Signed)
 Inbound call from patient requesting a call from nurse, he states that he is experiencing swallowing issues and tightness at times. He is not having issues with heartburn.  Please advise.

## 2023-09-05 NOTE — Telephone Encounter (Signed)
 I agree with your recommendations. Agree with follow-up as planned.  Thanks

## 2023-09-08 ENCOUNTER — Ambulatory Visit (INDEPENDENT_AMBULATORY_CARE_PROVIDER_SITE_OTHER): Admitting: Sports Medicine

## 2023-09-08 ENCOUNTER — Other Ambulatory Visit: Payer: Self-pay

## 2023-09-08 VITALS — BP 126/78 | Ht 72.0 in | Wt 193.0 lb

## 2023-09-08 DIAGNOSIS — M25552 Pain in left hip: Secondary | ICD-10-CM

## 2023-09-08 NOTE — Telephone Encounter (Signed)
 Patient called to follow up on message below asked if you can call his cell.

## 2023-09-08 NOTE — Patient Instructions (Signed)
 You have a small avulsion injury of the left greater trochanter with surrounding swelling seen on ultrasound today.  Ice the area if it is painful, but we need to try and work on hip rotation and stabilization strength. Do the 4 exercises we provided 3 sets/day for 5-7 days per week.  Let's follow-up in 4 weeks to check in and discuss next steps.

## 2023-09-08 NOTE — Progress Notes (Addendum)
   PCP: Geoffry Paradise, MD  SUBJECTIVE:   HPI:  Patient is a 68 y.o. male here with chief complaint of left hip pain ongoing for the past 3 weeks. He describes it as pain in the left buttock without radiation. Pain worsened with prolonged sitting. Pain similar to the pain he had last year in the right hip which ended up being a traction spur at the proximal glute attachment to the iliac crest.  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 He also banged his left elbow and right knee in the fall, but pain there has resolved. His hip pain is worse after the fall and he still has bruising over the lateral aspect of the hip.    Hurts with activity.  No groin pain.    PERTINENT  PMH / PSH / FH / SH:  Past Medical, Surgical, Social, and Family History Reviewed & Updated in the EMR.  Pertinent findings include:  Bilateral knee osteoarthrtitis  No Known Allergies  OBJECTIVE:  BP 126/78   Ht 6' (1.829 m)   Wt 193 lb (87.5 kg)   BMI 26.18 kg/m   PHYSICAL EXAM:  GEN: Alert and Oriented, NAD, comfortable in exam room RESP: Unlabored respirations, symmetric chest rise PSY: normal mood, congruent affect   LEFT HIP MSK EXAM: Left hip without bony deformity or limb shortening. He has moderate late stage ecchymosis over the lateral hip without skin breakdown.  Hip ROM is full Strength - hip flexion 5/5, hip abduction 4+/5 but not very painful TTP along distal half of the glute medius/minimus and at the greater trochanter, otherwise non-tender Negative FABER/FADIR/Piriformis stretch Negative Stenchfield Negative logroll Negative hop test  Limited MSK U/S of the left hip: Swelling noted over the left greater trochanter. Gluteus medius and minimus tendons visualized at their insertion to the greater trochanter without fiber disruption Small hyperechoic avulsion off the greater  trochanter Hyperechoic muscle texture in the deep middle portion of the gluteus medius.  Impression: Small Hyperechoic fragments off of the left greater trochanter at the attachment of the gluteus medius with hypoechoic swelling noted.  Some old appearing gluteus medius muscle belly strain marked by hyperechoic change in muscle.  U/S performed and interpreted by Glean Salen, MD with supervision of Roanna Epley, MD Assessment & Plan Left hip pain Exam and U/S consistent with contusion injury of the left greater trochanter. Exam is largely reassuring and stable. Recommended relative rest, icing and hip abductor strengthening. Exercises reviewed and exemplified to the patient. Will follow-up in 4 weeks - if failing to improve consider formal PT +/- ESWT. Patient's questions were answered and they are in agreement with this plan.  Glean Salen, MD PGY-4, Sports Medicine Fellow [facility]  I observed and examined the patient with the [facility] resident and agree with assessment and plan.  Note reviewed and modified by me. Sterling Big, MD

## 2023-09-08 NOTE — Telephone Encounter (Signed)
 Spoke to patient who is seeking additional advice for his discomfort with swallowing. He states that he did not get the omeprazole  previously recommended because he read the side effects and thought it was too dangerous. He did, instead, take pepcid AC. However, he states that this does not help. He asks if I think he should take omeprazole. I advised that recommendation was for PPI, omeprazole, however, if he is uncomfortable taking it, he may remain on pepcid. Unfortunately, I cannot give much more advice than what was previously given until he is seen in the office on 09/13/23 for additional evaluation. Patient verbalizes understanding.

## 2023-09-13 ENCOUNTER — Encounter: Payer: Self-pay | Admitting: Nurse Practitioner

## 2023-09-13 ENCOUNTER — Ambulatory Visit: Admitting: Nurse Practitioner

## 2023-09-13 VITALS — BP 110/68 | HR 75 | Ht 72.0 in | Wt 189.8 lb

## 2023-09-13 DIAGNOSIS — R1310 Dysphagia, unspecified: Secondary | ICD-10-CM

## 2023-09-13 DIAGNOSIS — Z860101 Personal history of adenomatous and serrated colon polyps: Secondary | ICD-10-CM

## 2023-09-13 DIAGNOSIS — K5900 Constipation, unspecified: Secondary | ICD-10-CM | POA: Diagnosis not present

## 2023-09-13 DIAGNOSIS — Z800 Family history of malignant neoplasm of digestive organs: Secondary | ICD-10-CM

## 2023-09-13 NOTE — Progress Notes (Signed)
 -     09/13/2023 Jonathan Wagner [id number] [birth year]   Chief Complaint: Difficulty swallowing, pain with swallowing   History of Present Illness: Jonathan Wagner is a 68 year old male with a past medical history of colon polyps.  He is known by Dr. Marina Goodell. He presents today for further evaluation regarding dysphagia and odynophagia. On 08/12/2023 he saw his urologist due to having a weak urine stream with dysuria and he was prescribed Valacyclovir and Doxycycline x 14 days. On 09/03/2023 he felt esophageal tightening when swallowing with associated discomfort into his esophagus which occurred only when eating.  No heartburn.  He started drinking warm liquids and chewed his food to the point of being nearly pured and he reduced his coffee intake.  He previously drink 1 or 2 beers most days and stopped drinking beer as well. He felt fairly well on 3/17 and 3/18 then his symptoms recurred on 3/19 then abated for the next 2 days. On 3/23 he drank a large amount of water with 1 hard swallow and his esophageal discomfort recurred for the rest of the day and for the past 2 days he has been asymptomatic. He took Pepcid x 2 doses which was ineffective so he stopped taking it.  He contacted our office and was advised to initiate Omeprazole 20 mg daily prior to his appointment but after reading the potential side effects he did not wish to start it.  He denies ever having an EGD.  He stated his twin brother was recently diagnosed with squamous cell esophageal cancer per evaluation at [facility], smokeless tobacco use possibly a contributing factor.  He denies having any chest pain that is unrelated to eating.  He remains quite active and plays tennis 3 days weekly.  He stated seeing his PCP who was not concerned about any cardiac issues.  He passes solid stools, no bloody or black stools.  Addendum: He endorses having an irregular bowel pattern, sometimes skips a few days without passing a BM.   Most recent GI  PROCEDURES:  Colonoscopy 06/06/2023: - Two 1 to 6 mm polyps in the transverse colon and in the cecum.  - Diverticulosis in the left colon.  - Internal hemorrhoids.  - The examination was otherwise normal on direct and retroflexion views - 5 year recall colonoscopy  1. Surgical [P], colon, cecum, polyp (1) :       - SESSILE SERRATED POLYP WITHOUT DYSPLASIA   2. Surgical [P], colon, transverse, polyp (1) :       - SESSILE SERRATED POLYP WITHOUT DYSPLASIA .  Current Outpatient Medications on File Prior to Visit  Medication Sig Dispense Refill   diclofenac sodium (VOLTAREN) 1 % GEL Apply 2 g topically 4 (four) times daily. 100 g 1   Magnesium 200 MG TABS Take by mouth. 200mg  in AM and PM dose 100 Multi Magnesiums     Multiple Vitamins-Minerals (MULTIVITAMIN WITH MINERALS) tablet Take 1 tablet by mouth daily.     Probiotic Product (PROBIOTIC DAILY PO) Take by mouth.     silodosin (RAPAFLO) 8 MG CAPS capsule Take 8 mg by mouth daily.     tadalafil (CIALIS) 5 MG tablet 1 tablet as needed Orally Once a day     vitamin C (ASCORBIC ACID) 250 MG tablet Take 250 mg by mouth daily.     No current facility-administered medications on file prior to visit.   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  Review of Systems:   Constitutional: Negative for fever, sweats, chills or weight loss.  Respiratory: Negative for shortness of breath.   Cardiovascular: Negative for chest pain, palpitations and leg swelling.  Gastrointestinal: See HPI.  Musculoskeletal: Negative for back pain or muscle aches.  Neurological: Negative for  dizziness, headaches or paresthesias.   Physical Exam: BP 110/68   Pulse 75   Ht 6' (1.829 m)   Wt 189 lb 12.8 oz (86.1 kg)   BMI 25.74 kg/m  General: 68 year old male in no acute distress. Head: Normocephalic and atraumatic. Eyes: No scleral icterus. Conjunctiva pink . Ears: Normal auditory acuity. Mouth: Dentition intact. No ulcers or lesions.  Lungs: Clear throughout to auscultation. Heart: Regular rate and rhythm, no murmur. Chest: Prominent bony process to the xiphoid.  Abdomen: Soft, nontender and nondistended. No masses or hepatomegaly. Normal bowel sounds x 4 quadrants.  Rectal: Deferred.  Musculoskeletal: Symmetrical with no gross deformities. Extremities: No edema. Neurological: Alert oriented x 4. No focal deficits.  Psychological: Alert and cooperative. Normal mood and affect  Assessment and Recommendations:  68 year old male with acute onset dysphagia with odynophagia, suspect secondary to Doxycycline.  No heartburn.  Twin brother recently diagnosed with squamous cell esophageal cancer. -EGD to rule out esophagitis, Doxycycline pill esophagitis, candidiasis esophagitis as well as esophageal malignancy, benefits and risks discussed including risk with sedation, risk of bleeding, perforation and infection  -Patient prefers not to take PPI -Gaviscon 1 tablespoon 3 times daily as needed -Further recommendations to be determined after EGD completed -Patient to contact our office if his symptoms worsen prior to his EGD date  History of colon polyps.  Colonoscopy 05/2023 identified to sessile serrated polyps removed from the colon.  No known family history of colon polyps or colorectal cancer. -Next colonoscopy due 05/2028  Constipation -Patient instructed to drink 64 oz of water daily, increase dietary fiber as tolerated -Benefiber 1 tablespoon daily -MiraLAX nightly as needed

## 2023-09-13 NOTE — Patient Instructions (Addendum)
 You have been scheduled for an endoscopy. Please follow written instructions given to you at your visit today.  If you use inhalers (even only as needed), please bring them with you on the day of your procedure.  If you take any of the following medications, they will need to be adjusted prior to your procedure:   Gaviscon liquid- take 1 tablespoon by mouth 3 times daily (over the counter) ____________________________________________________________________  Due to recent changes in healthcare laws, you may see the results of your imaging and laboratory studies on MyChart before your provider has had a chance to review them.  We understand that in some cases there may be results that are confusing or concerning to you. Not all laboratory results come back in the same time frame and the provider may be waiting for multiple results in order to interpret others.  Please give [place] 48 hours in order for your provider to thoroughly review all the results before contacting the office for clarification of your results.   Thank you for trusting me with your gastrointestinal care!   Alcide Evener, CRNP

## 2023-09-14 ENCOUNTER — Telehealth: Payer: Self-pay | Admitting: Nurse Practitioner

## 2023-09-14 NOTE — Progress Notes (Signed)
 Noted.

## 2023-09-14 NOTE — Telephone Encounter (Signed)
 Inbound call from patient requesting to speak with [facility] regarding his visit yesterday. Patient wants to speak with her because he says that his symptoms that he is having occurs on everyday 3rd day no matter what he does. He also wanted to discuss that his bowel habits are irregular. And that the Silodosin that he takes, he has been reading on the side effects and it states tightening of the throat as well as difficulty swallowing and is wondering if that can be causing some of his issues. Please advise.

## 2023-09-14 NOTE — Telephone Encounter (Signed)
 I am off work today, however,  I am checking this message at this time. I attempted to call [name] at this time, he did not answer. I left a detailed message on his voice mail advising him to contact the provider who prescribes the Silodosin to further discuss his concerns about this medication and to consider stopping it and monitor his throat tightening symptoms. My message also advised the patient to proceed with the EGD as planned. I will attempt to call him tomorrow when I am back in the office to further discuss his bowel concerns.

## 2023-09-15 NOTE — Telephone Encounter (Signed)
 I spoke to Jonathan Wagner, he wanted to let me know that he's had some constipation issues, no bloody or black stools. Sometimes skips a day or two without passing a BM. I advised him to drink 64 oz of water daily, increase dietary fiber as tolerated, take Benefiber in the morning and Miralax Q HS.   He felt well regarding his swallowing sx, esophageal tightening at the time of his office appointment 3/25 but noted these symptoms recur every 3 days, but are improving.   He is concerned that Silodosin prescribed by his urologist may be causing his swallowing issues and throat tightness. He called his urologist, awaiting a return call. I advised patient to further discuss these concerns with his urologist.   Patient will contact our office if his symptoms worsen prior to his EGD date.  He appreciated the call today.

## 2023-09-20 ENCOUNTER — Encounter: Payer: Self-pay | Admitting: Internal Medicine

## 2023-09-20 ENCOUNTER — Ambulatory Visit (AMBULATORY_SURGERY_CENTER): Admitting: Internal Medicine

## 2023-09-20 VITALS — BP 113/64 | HR 58 | Temp 97.8°F | Resp 11 | Ht 72.0 in | Wt 189.0 lb

## 2023-09-20 DIAGNOSIS — K449 Diaphragmatic hernia without obstruction or gangrene: Secondary | ICD-10-CM | POA: Diagnosis not present

## 2023-09-20 DIAGNOSIS — K222 Esophageal obstruction: Secondary | ICD-10-CM | POA: Diagnosis not present

## 2023-09-20 DIAGNOSIS — K219 Gastro-esophageal reflux disease without esophagitis: Secondary | ICD-10-CM | POA: Diagnosis not present

## 2023-09-20 DIAGNOSIS — R1310 Dysphagia, unspecified: Secondary | ICD-10-CM | POA: Diagnosis not present

## 2023-09-20 MED ORDER — PANTOPRAZOLE SODIUM 40 MG PO TBEC: 40.0000 mg | DELAYED_RELEASE_TABLET | Freq: Every day | ORAL | 11 refills | Status: DC

## 2023-09-20 MED ORDER — SODIUM CHLORIDE 0.9 % IV SOLN: 500.0000 mL | Freq: Once | INTRAVENOUS | Status: DC

## 2023-09-20 NOTE — Patient Instructions (Signed)

## 2023-09-20 NOTE — Progress Notes (Signed)
 Pt's states no medical or surgical changes since previsit or office visit.

## 2023-09-20 NOTE — Progress Notes (Signed)
 A/o x 3, VSS, gd SR's, pleased with anesthesia, report to RN

## 2023-09-20 NOTE — Op Note (Signed)
 [facility] Patient Name: Jonathan Wagner Procedure Date: 09/20/2023 9:07 AM MRN: [id number] Endoscopist: Wilhemina Bonito. Marina Goodell , MD, [id number] Age: 67 Referring MD:  Date of Birth: 1955/07/09 Gender: Male Account #: [id number] Procedure:                Upper GI endoscopy Indications:              Dysphagia, Odynophagia Medicines:                Monitored Anesthesia Care Procedure:                Pre-Anesthesia Assessment:                           - Prior to the procedure, a History and Physical                            was performed, and patient medications and                            allergies were reviewed. The patient's tolerance of                            previous anesthesia was also reviewed. The risks                            and benefits of the procedure and the sedation                            options and risks were discussed with the patient.                            All questions were answered, and informed consent                            was obtained. Prior Anticoagulants: The patient has                            taken no anticoagulant or antiplatelet agents. ASA                            Grade Assessment: II - A patient with mild systemic                            disease. After reviewing the risks and benefits,                            the patient was deemed in satisfactory condition to                            undergo the procedure.                           After obtaining informed consent, the endoscope was  passed under direct vision. Throughout the                            procedure, the patient's blood pressure, pulse, and                            oxygen saturations were monitored continuously. The                            Olympus Scope SN [id number] was introduced through the                            mouth, and advanced to the second part of duodenum.                            The upper GI endoscopy was  accomplished without                            difficulty. The patient tolerated the procedure                            well. Scope In: Scope Out: Findings:                 The esophagus revealed partial stricturing at the                            gastroesophageal junction and edema consistent with                            reflux. No Barrett's.                           The stomach was normal except for a sliding hiatal                            hernia.                           The examined duodenum was normal.                           The cardia and gastric fundus were normal on                            retroflexion. Complications:            No immediate complications. Estimated Blood Loss:     Estimated blood loss: none. Impression:               1. GERD with mild stricturing esophagitis                           2. Small hiatal hernia                           3. Otherwise unremarkable EGD. Recommendation:  1. Patient has a contact number available for                            emergencies. The signs and symptoms of potential                            delayed complications were discussed with the                            patient. Return to normal activities tomorrow.                            Written discharge instructions were provided to the                            patient.                           2. Resume previous diet. Reflux precautions                           3. Continue present medications.                           4. PRESCRIBE pantoprazole 40 mg daily; #30; 11                            refills. Take 1 DAILY until your follow-up                            appointment with Dr. Marina Goodell                           5. Schedule repeat EGD in [facility] Dr. Marina Goodell in 6 to 8                            weeks to assess for mucosal healing. Wilhemina Bonito. Marina Goodell, MD 09/20/2023 9:41:07 AM This report has been signed electronically.

## 2023-09-20 NOTE — Progress Notes (Signed)
 09/13/2023 Anthem Frazer [id number] [birth year]     Chief Complaint: Difficulty swallowing, pain with swallowing    History of Present Illness: Jonathan Wagner is a 68 year old male with a past medical history of colon polyps.  He is known by Dr. Marina Wagner. He presents today for further evaluation regarding dysphagia and odynophagia. On 08/12/2023 he saw his urologist due to having a weak urine stream with dysuria and he was prescribed Valacyclovir and Doxycycline x 14 days. On 09/03/2023 he felt esophageal tightening when swallowing with associated discomfort into his esophagus which occurred only when eating.  No heartburn.  He started drinking warm liquids and chewed his food to the point of being nearly pured and he reduced his coffee intake.  He previously drink 1 or 2 beers most days and stopped drinking beer as well. He felt fairly well on 3/17 and 3/18 then his symptoms recurred on 3/19 then abated for the next 2 days. On 3/23 he drank a large amount of water with 1 hard swallow and his esophageal discomfort recurred for the rest of the day and for the past 2 days he has been asymptomatic. He took Pepcid x 2 doses which was ineffective so he stopped taking it.  He contacted our office and was advised to initiate Omeprazole 20 mg daily prior to his appointment but after reading the potential side effects he did not wish to start it.  He denies ever having an EGD.  He stated his twin brother was recently diagnosed with squamous cell esophageal cancer per evaluation at [facility], smokeless tobacco use possibly a contributing factor.  He denies having any chest pain that is unrelated to eating.  He remains quite active and plays tennis 3 days weekly.  He stated seeing his PCP who was not concerned about any cardiac issues.  He passes solid stools, no bloody or black stools.  Addendum: He endorses having an irregular bowel pattern, sometimes skips a few days without passing a BM.    Most recent GI  PROCEDURES:   Colonoscopy 06/06/2023: - Two 1 to 6 mm polyps in the transverse colon and in the cecum.  - Diverticulosis in the left colon.  - Internal hemorrhoids.  - The examination was otherwise normal on direct and retroflexion views - 5 year recall colonoscopy  1. Surgical [P], colon, cecum, polyp (1) :       - SESSILE SERRATED POLYP WITHOUT DYSPLASIA   2. Surgical [P], colon, transverse, polyp (1) :       - SESSILE SERRATED POLYP WITHOUT DYSPLASIA .   Medications Ordered Prior to Encounter        Current Outpatient Medications on File Prior to Visit  Medication Sig Dispense Refill   diclofenac sodium (VOLTAREN) 1 % GEL Apply 2 g topically 4 (four) times daily. 100 g 1   Magnesium 200 MG TABS Take by mouth. 200mg  in AM and PM dose 100 Multi Magnesiums       Multiple Vitamins-Minerals (MULTIVITAMIN WITH MINERALS) tablet Take 1 tablet by mouth daily.       Probiotic Product (PROBIOTIC DAILY PO) Take by mouth.       silodosin (RAPAFLO) 8 MG CAPS capsule Take 8 mg by mouth daily.       tadalafil (CIALIS) 5 MG tablet 1 tablet as needed Orally Once a day       vitamin C (ASCORBIC ACID) 250 MG tablet Take 250 mg by mouth daily.  No current facility-administered medications on file prior to visit.           Past Surgical History:  Procedure Laterality Date   COLONOSCOPY   2006, 2013    Jonathan Wagner - polyps   FATTY TUMOR        REMOVED AS CHILD   HERNIA REPAIR Right      2020   POLYPECTOMY   12/04/2004   SHOULDER SURGERY Left     WISDOM TOOTH EXTRACTION            Current Medications, Allergies, Past Medical History, Past Surgical History, Family History and Social History were reviewed in [employer] electronic medical record.   Review of Systems:   Constitutional: Negative for fever, sweats, chills or weight loss.  Respiratory: Negative for shortness of breath.   Cardiovascular: Negative for chest pain, palpitations and leg swelling.  Gastrointestinal: See HPI.   Musculoskeletal: Negative for back pain or muscle aches.  Neurological: Negative for dizziness, headaches or paresthesias.    Physical Exam: BP 110/68   Pulse 75   Ht 6' (1.829 m)   Wt 189 lb 12.8 oz (86.1 kg)   BMI 25.74 kg/m  General: 68 year old male in no acute distress. Head: Normocephalic and atraumatic. Eyes: No scleral icterus. Conjunctiva pink . Ears: Normal auditory acuity. Mouth: Dentition intact. No ulcers or lesions.  Lungs: Clear throughout to auscultation. Heart: Regular rate and rhythm, no murmur. Chest: Prominent bony process to the xiphoid.  Abdomen: Soft, nontender and nondistended. No masses or hepatomegaly. Normal bowel sounds x 4 quadrants.  Rectal: Deferred.  Musculoskeletal: Symmetrical with no gross deformities. Extremities: No edema. Neurological: Alert oriented x 4. No focal deficits.  Psychological: Alert and cooperative. Normal mood and affect   Assessment and Recommendations:   68 year old male with acute onset dysphagia with odynophagia, suspect secondary to Doxycycline.  No heartburn.  Twin brother recently diagnosed with squamous cell esophageal cancer. -EGD to rule out esophagitis, Doxycycline pill esophagitis, candidiasis esophagitis as well as esophageal malignancy, benefits and risks discussed including risk with sedation, risk of bleeding, perforation and infection  -Patient prefers not to take PPI -Gaviscon 1 tablespoon 3 times daily as needed -Further recommendations to be determined after EGD completed -Patient to contact our office if his symptoms worsen prior to his EGD date  History of colon polyps.  Colonoscopy 05/2023 identified to sessile serrated polyps removed from the colon.  No known family history of colon polyps or colorectal cancer. -Next colonoscopy due 05/2028   Constipation -Patient instructed to drink 64 oz of water daily, increase dietary fiber as tolerated -Benefiber 1 tablespoon daily -MiraLAX nightly as needed

## 2023-09-21 ENCOUNTER — Telehealth: Payer: Self-pay

## 2023-09-21 NOTE — Telephone Encounter (Signed)
  Follow up Call-     09/20/2023    8:20 AM 06/06/2023    7:08 AM  Call back number  Post procedure Call Back phone  # [phone]  Permission to leave phone message Yes Yes     Patient questions:  Do you have a fever, pain , or abdominal swelling? No. Pain Score  0 *  Have you tolerated food without any problems? Yes.    Have you been able to return to your normal activities? Yes.    Do you have any questions about your discharge instructions: Diet   No. Medications  No. Follow up visit  No.  Do you have questions or concerns about your Care? No.  Actions: * If pain score is 4 or above: No action needed, pain <4.

## 2023-09-26 ENCOUNTER — Telehealth: Payer: Self-pay | Admitting: Internal Medicine

## 2023-09-26 NOTE — Telephone Encounter (Signed)
 See note below and advise if ok for pt to have EGD done 5/1.

## 2023-09-26 NOTE — Telephone Encounter (Signed)
See note from Dr. Perry below. 

## 2023-09-26 NOTE — Telephone Encounter (Signed)
 That would work out just fine.  Thanks

## 2023-09-26 NOTE — Telephone Encounter (Signed)
 Patients wife called asking if the patient would be able to reschedule EGD for an earlier date due to a scheduling conflict. They are interested in 10/20/23 but she would like to know if that would be okay for the patient since he had some inflammation.

## 2023-10-19 DIAGNOSIS — R3912 Poor urinary stream: Secondary | ICD-10-CM | POA: Diagnosis not present

## 2023-10-19 DIAGNOSIS — N401 Enlarged prostate with lower urinary tract symptoms: Secondary | ICD-10-CM | POA: Diagnosis not present

## 2023-10-20 ENCOUNTER — Ambulatory Visit: Admitting: Internal Medicine

## 2023-10-20 ENCOUNTER — Telehealth: Payer: Self-pay | Admitting: Internal Medicine

## 2023-10-20 ENCOUNTER — Encounter: Payer: Self-pay | Admitting: Internal Medicine

## 2023-10-20 VITALS — BP 103/74 | HR 55 | Temp 98.1°F | Resp 13 | Ht 72.0 in | Wt 189.0 lb

## 2023-10-20 DIAGNOSIS — K449 Diaphragmatic hernia without obstruction or gangrene: Secondary | ICD-10-CM | POA: Diagnosis not present

## 2023-10-20 DIAGNOSIS — K222 Esophageal obstruction: Secondary | ICD-10-CM

## 2023-10-20 DIAGNOSIS — R1310 Dysphagia, unspecified: Secondary | ICD-10-CM | POA: Diagnosis not present

## 2023-10-20 DIAGNOSIS — K2100 Gastro-esophageal reflux disease with esophagitis, without bleeding: Secondary | ICD-10-CM | POA: Diagnosis not present

## 2023-10-20 MED ORDER — SODIUM CHLORIDE 0.9 % IV SOLN: 500.0000 mL | Freq: Once | INTRAVENOUS | Status: DC

## 2023-10-20 NOTE — Progress Notes (Signed)
 HISTORY OF PRESENT ILLNESS:  Jonathan Wagner is a 68 y.o. male seen recently for odynophagia dysphagia.  Upper endoscopy revealed esophagitis and stricturing of the distal esophagus.  Placed on PPI.  Now for follow-up endoscopy with possible esophageal dilation  REVIEW OF SYSTEMS:  All non-GI ROS negative except for  Past Medical History:  Diagnosis Date   Arthritis    knees - mild   BPH (benign prostatic hyperplasia)     Past Surgical History:  Procedure Laterality Date   COLONOSCOPY  2006, 2013   Jonathan Wagner - polyps   COLONOSCOPY  2025   FATTY TUMOR     REMOVED AS CHILD   HERNIA REPAIR Right    2020   POLYPECTOMY  12/04/2004   SHOULDER SURGERY Left    WISDOM TOOTH EXTRACTION      Social History Jonathan Wagner  reports that he has never smoked. He has never used smokeless tobacco. He reports current alcohol  use of about 16.0 standard drinks of alcohol  per week. He reports that he does not use drugs.  family history includes Breast cancer in his mother; Prostate cancer in his father.  No Known Allergies     PHYSICAL EXAMINATION: Vital signs: BP 118/75   Pulse (!) 55   Temp 98.1 F (36.7 C) (Temporal)   Resp 11   Ht 6' (1.829 m)   Wt 189 lb (85.7 kg)   SpO2 98%   BMI 25.63 kg/m  General: Well-developed, well-nourished, no acute distress HEENT: Sclerae are anicteric, conjunctiva pink. Oral mucosa intact Lungs: Clear Heart: Regular Abdomen: soft, nontender, nondistended, no obvious ascites, no peritoneal signs, normal bowel sounds. No organomegaly. Extremities: No edema Psychiatric: alert and oriented x3. Cooperative     ASSESSMENT:  GERD with esophagitis and stricture   PLAN:  EGD with possible dilation of the esophagus

## 2023-10-20 NOTE — Op Note (Signed)
  [facility] Patient Name: Jonathan Wagner Procedure Date: 10/20/2023 10:41 AM MRN: [id number] Endoscopist: Murel Arlington. [name] , MD, [id number] Age: 68 Referring MD:  Date of Birth: 24-Apr-1956 Gender: Male Account #: [id number] Procedure:                Upper GI endoscopy with biopsies; balloon dilation                            of the esophagus?"20 mm max Indications:              Dysphagia, Esophageal reflux. Recent upper                            endoscopy for odynophagia dysphagia. July 23, 2023. Esophagitis. Now for follow-up to assess for                            mucosal healing and stricture dilation. Medicines:                Monitored Anesthesia Care Procedure:                Pre-Anesthesia Assessment:                           - Prior to the procedure, a History and Physical                            was performed, and patient medications and                            allergies were reviewed. The patient's tolerance of                            previous anesthesia was also reviewed. The risks                            and benefits of the procedure and the sedation                            options and risks were discussed with the patient.                            All questions were answered, and informed consent                            was obtained. Prior Anticoagulants: The patient has                            taken no anticoagulant or antiplatelet agents.                            After reviewing the risks and benefits, the patient  was deemed in satisfactory condition to undergo the                            procedure.                           After obtaining informed consent, the endoscope was                            passed under direct vision. Throughout the                            procedure, the patient's blood pressure, pulse, and                            oxygen saturations were  monitored continuously. The                            GIF [id number] #[id number] was introduced through the                            mouth, and advanced to the second part of duodenum.                            The upper GI endoscopy was accomplished without                            difficulty. The patient tolerated the procedure                            well. Scope In: Scope Out: Findings:                 The exam of the proximal esophagus was normal.                            Previous esophagitis at the distal portion has                            healed on medical therapy. Distal esophagus at GE                            junction/stricture biopsied                           One benign-appearing, intrinsic moderate stenosis                            was found 40 cm from the incisors. This stenosis                            measured 1.5 cm (inner diameter). A TTS dilator was                            passed through the scope. Dilation with an 18-19-20  mm balloon dilator was performed to 20 mm.                           The stomach was normal, save hiatal hernia.                           The examined duodenum was normal.                           The cardia and gastric fundus were normal on                            retroflexion. Complications:            No immediate complications. Estimated Blood Loss:     Estimated blood loss: none. Impression:               1. GERD with esophagitis. Healed on medical therapy.                           2. Distal esophageal stricture. Biopsied. Dilated.                           3. Small hiatal hernia. Otherwise normal EGD. Recommendation:           1. Patient has a contact number available for                            emergencies. The signs and symptoms of potential                            delayed complications were discussed with the                            patient. Return to normal activities tomorrow.                             Written discharge instructions were provided to the                            patient.                           2. Post dilation diet.                           3. Continue present medications.                           4. Continue pantoprazole  40 mg daily                           5. Follow-up biopsies                           6. Office follow-up with Dr. [name] in 10 to 12 weeks Murel Arlington. [name], MD 10/20/2023 11:15:35 AM This report has been signed  electronically.

## 2023-10-20 NOTE — Progress Notes (Signed)
 Report to PACU, RN, vss, BBS= Clear.

## 2023-10-20 NOTE — Progress Notes (Signed)
 Called to room to assist during endoscopic procedure.  Patient ID and intended procedure confirmed with present staff. Received instructions for my participation in the procedure from the performing physician.

## 2023-10-20 NOTE — Patient Instructions (Addendum)
 -Handout on hiatal hernia and stricture provided -await pathology results -Continue present medications -Continue Pantoprazole  40 mg daily  -Follow up with Dr. [name]   YOU HAD AN ENDOSCOPIC PROCEDURE TODAY AT THE [facility]:   Refer to the procedure report that was given to you for any specific questions about what was found during the examination.  If the procedure report does not answer your questions, please call your gastroenterologist to clarify.  If you requested that your care partner not be given the details of your procedure findings, then the procedure report has been included in a sealed envelope for you to review at your convenience later.  YOU SHOULD EXPECT: Some feelings of bloating in the abdomen. Passage of more gas than usual.  Walking can help get rid of the air that was put into your GI tract during the procedure and reduce the bloating. If you had a lower endoscopy (such as a colonoscopy or flexible sigmoidoscopy) you may notice spotting of blood in your stool or on the toilet paper. If you underwent a bowel prep for your procedure, you may not have a normal bowel movement for a few days.  Please Note:  You might notice some irritation and congestion in your nose or some drainage.  This is from the oxygen used during your procedure.  There is no need for concern and it should clear up in a day or so.  SYMPTOMS TO REPORT IMMEDIATELY:  Following upper endoscopy (EGD)  Vomiting of blood or coffee ground material  New chest pain or pain under the shoulder blades  Painful or persistently difficult swallowing  New shortness of breath  Fever of 100F or higher  Black, tarry-looking stools  For urgent or emergent issues, a gastroenterologist can be reached at any hour by calling [phone]. Do not use MyChart messaging for urgent concerns.    DIET:  Only have clear liquids for 2 hours after your procedure. The have soft foods for the rest of the day. Drink  plenty of fluids but you should avoid alcoholic beverages for 24 hours.  ACTIVITY:  You should plan to take it easy for the rest of today and you should NOT DRIVE or use heavy machinery until tomorrow (because of the sedation medicines used during the test).    FOLLOW UP: Our staff will call the number listed on your records the next business day following your procedure.  We will call around 7:15- 8:00 am to check on you and address any questions or concerns that you may have regarding the information given to you following your procedure. If we do not reach you, we will leave a message.     If any biopsies were taken you will be contacted by phone or by letter within the next 1-3 weeks.  Please call us  at [phone] if you have not heard about the biopsies in 3 weeks.    SIGNATURES/CONFIDENTIALITY: You and/or your care partner have signed paperwork which will be entered into your electronic medical record.  These signatures attest to the fact that that the information above on your After Visit Summary has been reviewed and is understood.  Full responsibility of the confidentiality of this discharge information lies with you and/or your care-partner.  Hiatal Hernia  A hiatal hernia occurs when part of the stomach slides above the muscle that separates the abdomen from the chest (diaphragm). A person can be born with a hiatal hernia (congenital), or it may develop over time. In  almost all cases of hiatal hernia, only the top part of the stomach pushes through the diaphragm. Many people have a hiatal hernia with no symptoms. The larger the hernia, the more likely it is that you will have symptoms. In some cases, a hiatal hernia allows stomach acid to flow back into the tube that carries food from your mouth to your stomach (esophagus). This may cause heartburn symptoms. The development of heartburn symptoms may mean that you have a condition called gastroesophageal reflux disease (GERD). What are  the causes? This condition is caused by a weakness in the opening (hiatus) where the esophagus passes through the diaphragm to attach to the upper part of the stomach. A person may be born with a weakness in the hiatus, or a weakness can develop over time. What increases the risk? This condition is more likely to develop in: Older people. Age is a major risk factor for a hiatal hernia, especially if you are over the age of 30. Pregnant women. People who are overweight. People who have frequent constipation. What are the signs or symptoms? Symptoms of this condition usually develop in the form of GERD symptoms. Symptoms include: Heartburn. Upset stomach (indigestion). Trouble swallowing. Coughing or wheezing. Wheezing is making high-pitched whistling sounds when you breathe. Sore throat. Chest pain. Nausea and vomiting. How is this diagnosed? This condition may be diagnosed during testing for GERD. Tests that may be done include: X-rays of your stomach or chest. An upper gastrointestinal (GI) series. This is an X-ray exam of your GI tract that is taken after you swallow a chalky liquid that shows up clearly on the X-ray. Endoscopy. This is a procedure to look into your stomach using a thin, flexible tube that has a tiny camera and light on the end of it. How is this treated? This condition may be treated by: Dietary and lifestyle changes to help reduce GERD symptoms. Medicines. These may include: Over-the-counter antacids. Medicines that make your stomach empty more quickly. Medicines that block the production of stomach acid (H2 blockers). Stronger medicines to reduce stomach acid (proton pump inhibitors). Surgery to repair the hernia, if other treatments are not helping. If you have no symptoms, you may not need treatment. Follow these instructions at home: Lifestyle and activity Do not use any products that contain nicotine or tobacco. These products include cigarettes, chewing  tobacco, and vaping devices, such as e-cigarettes. If you need help quitting, ask your health care provider. Try to achieve and maintain a healthy body weight. Avoid putting pressure on your abdomen. Anything that puts pressure on your abdomen increases the amount of acid that may be pushed up into your esophagus. Avoid bending over, especially after eating. Raise the head of your bed by putting blocks under the legs. This keeps your head and esophagus higher than your stomach. Do not wear tight clothing around your chest or stomach. Try not to strain when having a bowel movement, when urinating, or when lifting heavy objects. Eating and drinking Avoid foods that can worsen GERD symptoms. These may include: Fatty foods, like fried foods. Citrus fruits, like oranges or lemon. Other foods and drinks that contain acid, like orange juice or tomatoes. Spicy food. Chocolate. Eat frequent small meals instead of three large meals a day. This helps prevent your stomach from getting too full. Eat slowly. Do not lie down right after eating. Do not eat 1-2 hours before bed. Do not drink beverages with caffeine. These include cola, coffee, cocoa, and tea. Do  not drink alcohol . General instructions Take over-the-counter and prescription medicines only as told by your health care provider. Keep all follow-up visits. Your health care provider will want to check that any new prescribed medicines are helping your symptoms. Contact a health care provider if: Your symptoms are not controlled with medicines or lifestyle changes. You are having trouble swallowing. You have coughing or wheezing that will not go away. Your pain is getting worse. Your pain spreads to your arms, neck, jaw, teeth, or back. You feel nauseous or you vomit. Get help right away if: You have shortness of breath. You vomit blood. You have bright red blood in your stools. You have black, tarry stools. These symptoms may be an  emergency. Get help right away. Call 911. Do not wait to see if the symptoms will go away. Do not drive yourself to the hospital. Summary A hiatal hernia occurs when part of the stomach slides above the muscle that separates the abdomen from the chest. A person may be born with a weakness in the hiatus, or a weakness can develop over time. Symptoms of a hiatal hernia may include heartburn, trouble swallowing, or sore throat. Management of a hiatal hernia includes eating frequent small meals instead of three large meals a day. Get help right away if you vomit blood, have bright red blood in your stools, or have black, tarry stools. This information is not intended to replace advice given to you by your health care provider. Make sure you discuss any questions you have with your health care provider. Document Revised: 08/04/2021 Document Reviewed: 08/04/2021 Elsevier Patient Education  2024 [employer].

## 2023-10-20 NOTE — Telephone Encounter (Signed)
 Returned patient's phone call and reviewed the Soft Diet recommendations. Pt verbalized understanding.

## 2023-10-20 NOTE — Telephone Encounter (Signed)
 Patient called and stated that he just got home from his EGD procedure today. Patient has a couple of question regarding his post-op recovery instruction. Patient stated that he is confused on why he can't have half and half in his coffee but can having ice cream. Patient is requesting a call back at [phone]. Please advise.

## 2023-10-21 ENCOUNTER — Telehealth: Payer: Self-pay

## 2023-10-21 NOTE — Telephone Encounter (Signed)
  Follow up Call-     10/20/2023   10:06 AM 09/20/2023    8:20 AM 06/06/2023    7:08 AM  Call back number  Post procedure Call Back phone  # [phone]  Permission to leave phone message Yes Yes Yes     Patient questions:  Do you have a fever, pain , or abdominal swelling? No. Pain Score  0 *  Have you tolerated food without any problems? Yes.    Have you been able to return to your normal activities? Yes.    Do you have any questions about your discharge instructions: Diet   No. Medications  No. Follow up visit  No.  Do you have questions or concerns about your Care? No.  Actions: * If pain score is 4 or above: No action needed, pain <4.

## 2023-10-24 LAB — SURGICAL PATHOLOGY

## 2023-11-07 ENCOUNTER — Ambulatory Visit: Payer: Self-pay | Admitting: Internal Medicine

## 2023-11-08 ENCOUNTER — Encounter: Admitting: Internal Medicine

## 2023-12-15 ENCOUNTER — Telehealth: Payer: Self-pay | Admitting: Internal Medicine

## 2023-12-15 NOTE — Telephone Encounter (Signed)
 Patient is calling because he stated that he is unsure weather to request a refill for his pantoprazole  due to him having an appointment with Dr. Abran on July 15 th. Patient also stated that he would like a call back to advise him further on what to do. Please advise.

## 2023-12-15 NOTE — Telephone Encounter (Signed)
 Spoke with patient's wife who said patient has a refill - he is going to get that filled and then discuss if he should continue taking it at his upcoming appointment

## 2024-01-02 DIAGNOSIS — R350 Frequency of micturition: Secondary | ICD-10-CM | POA: Diagnosis not present

## 2024-01-02 DIAGNOSIS — N5201 Erectile dysfunction due to arterial insufficiency: Secondary | ICD-10-CM | POA: Diagnosis not present

## 2024-01-02 DIAGNOSIS — N401 Enlarged prostate with lower urinary tract symptoms: Secondary | ICD-10-CM | POA: Diagnosis not present

## 2024-01-03 ENCOUNTER — Encounter: Payer: Self-pay | Admitting: Internal Medicine

## 2024-01-03 ENCOUNTER — Ambulatory Visit: Admitting: Internal Medicine

## 2024-01-03 VITALS — BP 128/76 | HR 64 | Ht 72.0 in | Wt 183.8 lb

## 2024-01-03 DIAGNOSIS — R1310 Dysphagia, unspecified: Secondary | ICD-10-CM | POA: Diagnosis not present

## 2024-01-03 DIAGNOSIS — K222 Esophageal obstruction: Secondary | ICD-10-CM

## 2024-01-03 DIAGNOSIS — K219 Gastro-esophageal reflux disease without esophagitis: Secondary | ICD-10-CM | POA: Diagnosis not present

## 2024-01-03 MED ORDER — PANTOPRAZOLE SODIUM 40 MG PO TBEC: 40.0000 mg | DELAYED_RELEASE_TABLET | Freq: Every day | ORAL | 3 refills | Status: AC

## 2024-01-03 NOTE — Patient Instructions (Signed)
 We have sent the following medications to your pharmacy for you to pick up at your convenience:  Pantoprazole .  Please follow up in 6 months.  _______________________________________________________  If your blood pressure at your visit was 140/90 or greater, please contact your primary care physician to follow up on this.  _______________________________________________________  If you are age 70 or older, your body mass index should be between 23-30. Your Body mass index is 24.93 kg/m. If this is out of the aforementioned range listed, please consider follow up with your Primary Care Provider.  If you are age 12 or younger, your body mass index should be between 19-25. Your Body mass index is 24.93 kg/m. If this is out of the aformentioned range listed, please consider follow up with your Primary Care Provider.   ________________________________________________________  The [facility] providers would like to encourage you to use MYCHART to communicate with providers for non-urgent requests or questions.  Due to long hold times on the telephone, sending your provider a message by [facility] may be a faster and more efficient way to get a response.  Please allow 48 business hours for a response.  Please remember that this is for non-urgent requests.  _______________________________________________________

## 2024-01-03 NOTE — Progress Notes (Signed)
 HISTORY OF PRESENT ILLNESS:  Jonathan Wagner is a 68 y.o. male with past medical history as listed below who was evaluated in the office September 13, 2023 regarding difficulty with swallowing and pain with swallowing.  See that dictation.  He subsequently underwent upper endoscopy September 20, 2023.  He was found to have esophagitis and stricture.  He was placed on PPI.  He followed up 1 month later on Oct 20, 2023.  Relook endoscopy revealed healing of esophagitis.  The esophageal stricture was dilated to 20 mm.  The gastroesophageal junction was biopsied and revealed mild reflux changes.  He was told to continue pantoprazole  40 mg daily and follow-up at this time.  Overall the patient states that he is better.  Particularly with regards to his swallowing.  He never really had significant classic reflux symptoms.  He has been compliant with PPI.  He still states that every 4 to 5 days he may develop a symptom complex consisting of hiccups and gas and dyspeptic type symptoms in the epigastric region may last a day or day and a half.  Certainly no worse or more frequent.  He still has concerns understanding all symptoms in their entirety as well as about the possibility of more ominous diagnoses.  He mentions his twin brother was diagnosed with squamous cell esophageal cancer recently.  He also has concerns about long-term medication use and side effects.  He remains quite active with a healthy diet.    REVIEW OF SYSTEMS:  All non-GI ROS negative. 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 He has never used  smokeless tobacco. He reports current alcohol  use of about 16.0 standard drinks of alcohol  per week. He reports that he does not use drugs.  family history includes Breast cancer in his mother; Prostate cancer in his father.  No Known Allergies     PHYSICAL EXAMINATION: Vital signs: BP 128/76   Pulse 64   Ht 6' (1.829 m)   Wt 183 lb 12.8 oz (83.4 kg)   BMI 24.93 kg/m   Constitutional: generally well-appearing, no acute distress Psychiatric: alert and oriented x3, cooperative Eyes: Anicteric Abdomen: Not reexamined Extremities: no l visible abnormalities  Skin: Normal hue Neuro: No gross abnormalities  ASSESSMENT:  1.  GERD.  Endoscopic evidence of esophagitis.  Benign biopsies. 2.  Dysphagia with esophageal stricture.  Dilated.  Improved. 3.  Intermittent dyspeptic symptoms as described.  No worse. 4.  Health related anxiety 5.  General Medical care under the guidance of Dr. Shepard   PLAN:  1.  Continue pantoprazole  daily. 2.  I reviewed medication safety profile.  This medication is quite safe. 3.  Reassurance 4.  Office follow-up 6 months 5.  Contact the office in the interim for questions or problems A total time of 30 minutes was spent preparing to see the patient, obtaining interval history, performing medically appropriate physical exam, counseling and educating the patient regarding above listed issues, answering questions, ordering  medication, arranging follow-up, and documenting clinical information in the health record

## 2024-01-11 DIAGNOSIS — L2989 Other pruritus: Secondary | ICD-10-CM | POA: Diagnosis not present

## 2024-01-11 DIAGNOSIS — D225 Melanocytic nevi of trunk: Secondary | ICD-10-CM | POA: Diagnosis not present

## 2024-01-11 DIAGNOSIS — Z789 Other specified health status: Secondary | ICD-10-CM | POA: Diagnosis not present

## 2024-01-11 DIAGNOSIS — L815 Leukoderma, not elsewhere classified: Secondary | ICD-10-CM | POA: Diagnosis not present

## 2024-01-11 DIAGNOSIS — L821 Other seborrheic keratosis: Secondary | ICD-10-CM | POA: Diagnosis not present

## 2024-01-11 DIAGNOSIS — L814 Other melanin hyperpigmentation: Secondary | ICD-10-CM | POA: Diagnosis not present

## 2024-01-11 DIAGNOSIS — L820 Inflamed seborrheic keratosis: Secondary | ICD-10-CM | POA: Diagnosis not present

## 2024-01-11 DIAGNOSIS — L538 Other specified erythematous conditions: Secondary | ICD-10-CM | POA: Diagnosis not present

## 2024-02-21 DIAGNOSIS — M62511 Muscle wasting and atrophy, not elsewhere classified, right shoulder: Secondary | ICD-10-CM | POA: Diagnosis not present

## 2024-02-21 DIAGNOSIS — M62512 Muscle wasting and atrophy, not elsewhere classified, left shoulder: Secondary | ICD-10-CM | POA: Diagnosis not present

## 2024-02-21 DIAGNOSIS — M25512 Pain in left shoulder: Secondary | ICD-10-CM | POA: Diagnosis not present

## 2024-02-21 DIAGNOSIS — M25511 Pain in right shoulder: Secondary | ICD-10-CM | POA: Diagnosis not present

## 2024-03-01 DIAGNOSIS — M25511 Pain in right shoulder: Secondary | ICD-10-CM | POA: Diagnosis not present

## 2024-03-01 DIAGNOSIS — M62512 Muscle wasting and atrophy, not elsewhere classified, left shoulder: Secondary | ICD-10-CM | POA: Diagnosis not present

## 2024-03-01 DIAGNOSIS — M62511 Muscle wasting and atrophy, not elsewhere classified, right shoulder: Secondary | ICD-10-CM | POA: Diagnosis not present

## 2024-03-01 DIAGNOSIS — M25512 Pain in left shoulder: Secondary | ICD-10-CM | POA: Diagnosis not present

## 2024-03-08 DIAGNOSIS — M62511 Muscle wasting and atrophy, not elsewhere classified, right shoulder: Secondary | ICD-10-CM | POA: Diagnosis not present

## 2024-03-08 DIAGNOSIS — M25511 Pain in right shoulder: Secondary | ICD-10-CM | POA: Diagnosis not present

## 2024-03-08 DIAGNOSIS — M62512 Muscle wasting and atrophy, not elsewhere classified, left shoulder: Secondary | ICD-10-CM | POA: Diagnosis not present

## 2024-03-08 DIAGNOSIS — M25512 Pain in left shoulder: Secondary | ICD-10-CM | POA: Diagnosis not present

## 2024-03-21 DIAGNOSIS — Z125 Encounter for screening for malignant neoplasm of prostate: Secondary | ICD-10-CM | POA: Diagnosis not present

## 2024-03-21 DIAGNOSIS — Z1212 Encounter for screening for malignant neoplasm of rectum: Secondary | ICD-10-CM | POA: Diagnosis not present

## 2024-03-21 DIAGNOSIS — Z0189 Encounter for other specified special examinations: Secondary | ICD-10-CM | POA: Diagnosis not present

## 2024-03-21 DIAGNOSIS — E785 Hyperlipidemia, unspecified: Secondary | ICD-10-CM | POA: Diagnosis not present

## 2024-03-21 DIAGNOSIS — R5383 Other fatigue: Secondary | ICD-10-CM | POA: Diagnosis not present

## 2024-04-02 DIAGNOSIS — E785 Hyperlipidemia, unspecified: Secondary | ICD-10-CM | POA: Diagnosis not present

## 2024-04-02 DIAGNOSIS — Z0000 Encounter for general adult medical examination without abnormal findings: Secondary | ICD-10-CM | POA: Diagnosis not present

## 2024-04-02 DIAGNOSIS — Z1331 Encounter for screening for depression: Secondary | ICD-10-CM | POA: Diagnosis not present

## 2024-04-02 DIAGNOSIS — Z1339 Encounter for screening examination for other mental health and behavioral disorders: Secondary | ICD-10-CM | POA: Diagnosis not present

## 2024-04-02 DIAGNOSIS — K219 Gastro-esophageal reflux disease without esophagitis: Secondary | ICD-10-CM | POA: Diagnosis not present

## 2024-04-02 DIAGNOSIS — N401 Enlarged prostate with lower urinary tract symptoms: Secondary | ICD-10-CM | POA: Diagnosis not present

## 2024-04-24 ENCOUNTER — Ambulatory Visit: Admitting: Sports Medicine

## 2024-04-24 ENCOUNTER — Other Ambulatory Visit: Payer: Self-pay

## 2024-04-24 VITALS — BP 108/68 | Ht 72.0 in | Wt 185.0 lb

## 2024-04-24 DIAGNOSIS — M79642 Pain in left hand: Secondary | ICD-10-CM

## 2024-04-24 DIAGNOSIS — S60042A Contusion of left ring finger without damage to nail, initial encounter: Secondary | ICD-10-CM | POA: Diagnosis not present

## 2024-04-24 DIAGNOSIS — S60022A Contusion of left index finger without damage to nail, initial encounter: Secondary | ICD-10-CM

## 2024-04-24 DIAGNOSIS — M19072 Primary osteoarthritis, left ankle and foot: Secondary | ICD-10-CM | POA: Diagnosis not present

## 2024-04-24 NOTE — Progress Notes (Unsigned)
 [facility] A Department of [facility] [place]. [facility]   PCP: Shepard Ade, MD  CHIEF COMPLAINT: Left hand finger pain x 2, left dorsal foot pain  HPI: Patient is a pleasant 68 y.o. male who presents today for left hand pain from tennis injuries that occurred on 2 different occasions; first injury to left hand fourth intermediate phalanx occurred roughly 3 weeks ago. Patient states initial injury to fourth finger happened when he was hitting a forehand shot and  felt a pop in his fourth finger.  Looked down and had some bruising and initially thought he just popped a blood vessel.  Denies any functional limitations from fourth finger since injury but he does have a small callus formation on volar aspect of fourth intermediate phalanx that is painful when gripping his tennis racquet.  Placing tape over this part of his finger relieves pain.  The 2nd injury to left second finger proximal phalanx occurred last Thursday.  Injury to his left index finger happened when he got hit hard by a tennis ball which caused significant bruising and swelling.  He has buddy taped his index finger to his middle finger since then and endorses improvement of symptoms and decrease swelling/bruising.  Once again, no functional limitations in terms of finger flexion or extension from this injury.  Lastly, patient mentions pain over the dorsal aspect of his foot, radiating from his dorsal midfoot to his dorsal extensor hallicus longus.  Some pain with passive plantarflexion of this toe but primary pain comes from standing on his toes.  Denies any acute injury that initiated onset of pain.   PMH:  Past Medical History:  Diagnosis Date   Arthritis    knees - mild   BPH (benign prostatic hyperplasia)     Patient Active Problem List   Diagnosis Date Noted   Biceps tendon tear 05/07/2021   Shoulder stiffness, left 02/19/2020   Collateral ligament lateral disruption, old, left  01/08/2020   Deep groin pain 12/28/2018   Osteoarthritis of left knee 08/02/2017   Pain in joint, ankle and foot 03/03/2017   Haglund's deformity, bilateral 03/03/2017   Acquired unequal leg length 03/03/2017   Left shoulder pain 07/29/2016   Rotator cuff tear arthropathy, left 09/26/2014   Lateral epicondylitis of left elbow 11/30/2012   Posterior tibial tendinitis of left leg 11/30/2012   PFS (patellofemoral syndrome) 01/05/2011    PSurg:  Past Surgical History:  Procedure Laterality Date   COLONOSCOPY  2006, 2013   [facility] - polyps   COLONOSCOPY  2025   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  Meds:  Previous Medications   ALFUZOSIN (UROXATRAL) 10 MG 24 HR TABLET    Take 10 mg by mouth daily.   FINASTERIDE (PROSCAR) 5 MG TABLET    Take 5 mg by mouth daily.   MAGNESIUM 200 MG TABS    Take by mouth. 200mg  in AM and PM dose 100 Multi Magnesiums   PANTOPRAZOLE  (PROTONIX ) 40 MG TABLET    Take 1 tablet (40 mg total) by mouth daily.   PROBIOTIC PRODUCT (PROBIOTIC DAILY PO)    Take by mouth.   TADALAFIL (CIALIS) 5 MG TABLET    1 tablet as needed Orally Once a day   VITAMIN  C (ASCORBIC ACID) 250 MG TABLET    Take 250 mg by mouth daily.    Social:  Social History   Tobacco Use   Smoking status: Never   Smokeless tobacco: Never  Substance Use Topics   Alcohol  use: Yes    Alcohol /week: 16.0 standard drinks of alcohol     Types: 12 Standard drinks or equivalent, 4 Glasses of wine per week    Comment: DRINK A 12 PACK BEER PER WEEK    REVIEW OF SYSTEMS:  ROS negative except as noted in HPI above   Objective Exam:  Vitals:   04/24/24 0912  BP: 108/68  Weight: 185 lb (83.9 kg)  Height: 6' (1.829 m)    GENERAL: Patient is afebrile, Vital signs reviewed, well appearing, Patient appears comfortable, Alert and lucid. No  apparent distress.   Physical Exam   Ortho Exam:  Examination of left hand reveals no specific erythema, ecchymosis, or generalized edema present.  No evidence of skin breakdown.  Patient does have presence of small subcutaneous nodule over the volar aspect of left fourth intermediate phalanx, just proximal to DIP joint.  Small nodule is slightly mobile and mildly tender to palpation.  Patient has still active/passive range of motion of left fourth finger.  Flexor and extensor tendons still fully intact.  No laxity with varus or valgus stress testing.  Neurovascular intact distally.  Left index finger reveals mild/moderate swelling and ecchymosis surrounding proximal phalanx extending into MCP joint.  Patient still has full active/passive range of motion of left index finger at this time.  No specific tenderness to palpation over bony landmarks of metacarpals, MCP joint, proximal, intermediate, or distal phalanx.  PIP and DIP joints within normal limit.  Extensor and flexor tendons fully intact.  No varus or valgus laxity on stress testing.  Neurovascular intact distally.  Examination of left foot reveals no erythema, ecchymosis, or edema surrounding dorsal aspect of foot.  Patient endorses tenderness to palpation over path of extensor hallucis longus, specifically as it crosses over MTP joint.  Patient has prominent MTP joint suggestive of bony spurring osteoarthritis.  Patient has limited dorsiflexion of the great toe and evidence of hallux rigidus.  Remaining range of motion within normal limits.  Ultrasound Examination: Left hand and left first MTP Limited ultrasound evaluation of volar aspect of intermediate fourth phalanx reveals discrete, well-circumscribed, small hematoma superficial to flexor tendons.  Fully intact flexor tendons present.  No evidence of fractures to fourth fingers noted on exam.  The second proximal phalanx has increased swelling and hypoechoic changes surrounding his MCP  joint.  Cortex of second digit evaluated and no evidence of fractures.  Flexor and extensor tendons still fully intact.    Lastly, limited valuation of left first MTP joint reveals significant arthritic spurring on dorsal aspect of joint.  Has concurrent joint effusion present.  Extensor houses longus tendon fully intact.  Digital nerve visualized crossing over superior MTP spurring likely source of burning neuropathic pain surrounding dorsal aspect of left great toe.  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 Fields, MD    RESULTS:  Labs: No results found for this or any previous visit (from the past 48 hours).  Imaging:  No orders to display    Assessment/Plan:  1. Arthritis of first metatarsophalangeal (MTP) joint of left foot   2. Left hand pain   3. Hematoma of left index finger  4. Hematoma of left ring finger    -Patient having medial dorsal left foot pain over dorsal surface of MTP joint where digital nerve crosses over bony arthritic spurring.  Ultrasound examination revealed significant left first MTP arthritis as well as irritation of digital nerve as it crosses over bony spurs.  Recommended patient try applying Voltaren  gel over dorsal aspect of MCP joint 2-3 times daily and ice the area after playing tennis.  Will return to clinic in 1 month if pain is not improving with conservative management -Additionally patient has 2 separate hematomas on his left hand, the first on the volar aspect overlying intermediate phalanx of his fourth finger, the second over proximal phalanx and spreading into second MCP joint.  Both are improving with time.  No evidence of fractures on limited ultrasound evaluation.  Recommend patient apply hot compress to areas of concern 2-3 times daily and massage hematoma.  Can continue applying compressive tape and buddy taping fingers for added stability  and support while playing tennis.  If not improving in 1 month return for follow-up visit.  New Prescriptions   No medications on file    Medications, medical history, allergies, surgical history, hospitalizations, family history, social history, ROS and vitals entered by nursing staff and reviewed by myself.  I discussed with the patient the diagnosis, treatment plan, indications for return to the emergency department, and for expected follow-up. The patient verbalized an understanding. The patient is asked if there are any questions or concerns. We discuss the case, until all issues are addressed to the patient's satisfaction.  Follow up per instructions including returning for additional office visit if symptoms worsen or proceeding to the emergency department or urgent care in the next 12-24hrs if there is an acute concerning increasing symptoms, pain, fevers, or other symptoms.  Prentice Agent, DO  10:45 AM, 04/25/2024

## 2024-04-25 ENCOUNTER — Encounter: Payer: Self-pay | Admitting: Sports Medicine

## 2024-05-30 DIAGNOSIS — H52203 Unspecified astigmatism, bilateral: Secondary | ICD-10-CM | POA: Diagnosis not present

## 2024-05-30 DIAGNOSIS — H25013 Cortical age-related cataract, bilateral: Secondary | ICD-10-CM | POA: Diagnosis not present

## 2024-05-30 DIAGNOSIS — H18513 Endothelial corneal dystrophy, bilateral: Secondary | ICD-10-CM | POA: Diagnosis not present

## 2024-05-30 DIAGNOSIS — H2513 Age-related nuclear cataract, bilateral: Secondary | ICD-10-CM | POA: Diagnosis not present

## 2024-05-30 DIAGNOSIS — H01001 Unspecified blepharitis right upper eyelid: Secondary | ICD-10-CM | POA: Diagnosis not present

## 2024-05-30 DIAGNOSIS — H01004 Unspecified blepharitis left upper eyelid: Secondary | ICD-10-CM | POA: Diagnosis not present
